# Patient Record
Sex: Male | Born: 2000 | Race: White | Hispanic: No | Marital: Single | Smoking: Never smoker
Health system: Southern US, Community
[De-identification: ages and names within clinical notes are randomized; demographics above are authoritative.]

## PROBLEM LIST (undated history)

## (undated) DIAGNOSIS — F509 Eating disorder, unspecified: Secondary | ICD-10-CM

## (undated) DIAGNOSIS — F4310 Post-traumatic stress disorder, unspecified: Secondary | ICD-10-CM

## (undated) DIAGNOSIS — Z62810 Personal history of physical and sexual abuse in childhood: Secondary | ICD-10-CM

## (undated) DIAGNOSIS — K219 Gastro-esophageal reflux disease without esophagitis: Secondary | ICD-10-CM

## (undated) DIAGNOSIS — R519 Headache, unspecified: Secondary | ICD-10-CM

## (undated) DIAGNOSIS — F909 Attention-deficit hyperactivity disorder, unspecified type: Secondary | ICD-10-CM

## (undated) HISTORY — DX: Headache, unspecified: R51.9

## (undated) HISTORY — DX: Personal history of physical and sexual abuse in childhood: Z62.810

## (undated) HISTORY — DX: Gastro-esophageal reflux disease without esophagitis: K21.9

## (undated) HISTORY — PX: SKULL FRACTURE ELEVATION: SHX781

## (undated) HISTORY — DX: Post-traumatic stress disorder, unspecified: F43.10

## (undated) HISTORY — DX: Attention-deficit hyperactivity disorder, unspecified type: F90.9

## (undated) HISTORY — DX: Eating disorder, unspecified: F50.9

---
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
## 2020-06-26 ENCOUNTER — Telehealth: Payer: Self-pay

## 2020-06-26 NOTE — Telephone Encounter (Signed)
Suggested asthma and allergy of [facility]

## 2020-06-26 NOTE — Telephone Encounter (Signed)
Mom would like the name of a good allergy / asthma adult doctoer Deray can start seeing please

## 2020-08-03 ENCOUNTER — Emergency Department (HOSPITAL_COMMUNITY)
Admission: EM | Admit: 2020-08-03 | Discharge: 2020-08-03 | Disposition: A | Discharge status: Home / Self Care | Payer: BC Managed Care – PPO | Attending: Emergency Medicine | Admitting: Emergency Medicine

## 2020-08-03 ENCOUNTER — Other Ambulatory Visit: Payer: Self-pay

## 2020-08-03 ENCOUNTER — Encounter (HOSPITAL_COMMUNITY): Payer: Self-pay

## 2020-08-03 DIAGNOSIS — J4541 Moderate persistent asthma with (acute) exacerbation: Secondary | ICD-10-CM | POA: Insufficient documentation

## 2020-08-03 DIAGNOSIS — R0602 Shortness of breath: Secondary | ICD-10-CM | POA: Diagnosis present

## 2020-08-03 MED ORDER — PREDNISONE 20 MG PO TABS: ORAL_TABLET | ORAL | 0 refills | Status: DC

## 2020-08-03 NOTE — ED Provider Notes (Signed)
[facility]-EMERGENCY DEPT Provider Note   CSN: [id number] Arrival date & time: 08/03/20  1636     History Chief Complaint  Patient presents with  . Asthma    Cody Waters is a 19 y.o. male.  The history is provided by the patient. No language interpreter was used.  Asthma     19 year old male significant history of asthma presenting for evaluation of shortness of breath.  Patient report for the past few months he has noticed that his asthma has last controlled with his usual home medication.Marland Kitchen  He is starting to use his inhaler a lot more frequently due to that.  States sometimes he upward to 20 puffs a day to control his symptoms.  Today after leaving work he endorsed increase shortness of breath, took 8 to 9 puffs of his albuterol inhaler and came to the ED.  Patient states he is currently feeling much better.  He felt that his symptoms may be secondary to secondhand smoke as this roommate does vape.  He denies any fever runny nose sneezing sore throat or cold symptoms.  He has been fully vaccinated for COVID-19.  Denies any prior history of PE.  He does have an appointment with pulmonologist in 3 weeks.  Past Medical History:  Diagnosis Date  . ADHD (attention deficit hyperactivity disorder)   . Asthma   . GERD (gastroesophageal reflux disease)     Patient Active Problem List   Diagnosis Date Noted  . Body mass index, pediatric, 5th percentile to less than 85th percentile for age 42/29/2015  . Encounter for routine child health examination without abnormal findings 02/27/2013    Past Surgical History:  Procedure Laterality Date  . HYDROCELE EXCISION Right 07/25/2016   Procedure: RIGHT HYDROCELECTOMY PEDIATRIC;  Surgeon: Leonia Corona, MD;  Location: [facility];  Service: Pediatrics;  Laterality: Right;       Family History  Problem Relation Age of Onset  . Alcohol abuse Neg Hx   . Arthritis Neg Hx   . Asthma Neg Hx   . Birth  defects Neg Hx   . Cancer Neg Hx   . COPD Neg Hx   . Depression Neg Hx   . Diabetes Neg Hx   . Drug abuse Neg Hx   . Early death Neg Hx   . Hearing loss Neg Hx   . Heart disease Neg Hx   . Hyperlipidemia Neg Hx   . Hypertension Neg Hx   . Learning disabilities Neg Hx   . Kidney disease Neg Hx   . Mental illness Neg Hx   . Mental retardation Neg Hx   . Miscarriages / Stillbirths Neg Hx   . Stroke Neg Hx   . Vision loss Neg Hx   . Varicose Veins Neg Hx     Social History   Tobacco Use  . Smoking status: Never Smoker  . Smokeless tobacco: Never Used  Substance Use Topics  . Alcohol use: No  . Drug use: No    Home Medications Prior to Admission medications   Medication Sig Start Date End Date Taking? Authorizing Provider  albuterol (PROVENTIL) (2.5 MG/3ML) 0.083% nebulizer solution INHALE 1 NEBULE VIA NEBULIZER EVERY 4 HOURS AS NEEDED FOR WHEEZING OR SHORTNESS OF BREATH 07/12/19   Georgiann Hahn, MD  albuterol (VENTOLIN HFA) 108 (90 Base) MCG/ACT inhaler INHALE 2 PUFFS INTO THE LUNGS EVERY 6 HOURS AS NEEDED FOR WHEEZING OR SHORTNESS OF BREATH 09/06/19   Georgiann Hahn, MD  beclomethasone (  QVAR) 80 MCG/ACT inhaler INHALE 1 PUFF INTO THE LUNGS TWICE DAILY Patient taking differently: 1 puff. INHALE 1 PUFF INTO THE LUNGS TWICE DAILY 06/04/16 07/17/16  Georgiann Hahn, MD  fluticasone (FLOVENT HFA) 110 MCG/ACT inhaler Inhale 1 puff into the lungs 2 (two) times daily. 07/12/19 08/11/19  Georgiann Hahn, MD  guaiFENesin (MUCINEX) 600 MG 12 hr tablet Take 600 mg by mouth 2 (two) times daily as needed for cough.    [provider]  HYDROcodone-acetaminophen (NORCO/VICODIN) 5-325 MG tablet Take 1-1.5 tablets by mouth every 6 (six) hours as needed for moderate pain. 07/25/16   Leonia Corona, MD  loratadine (CLARITIN) 10 MG tablet Take 1 tablet (10 mg total) by mouth daily as needed for allergies. 07/02/18 08/02/18  Georgiann Hahn, MD  montelukast (SINGULAIR) 10 MG  tablet Take 1 tablet (10 mg total) by mouth at bedtime. 09/04/18   Myles Gip, DO  Multiple Vitamins-Minerals (MULTIVITAMIN PO) Take 1 tablet by mouth daily.    [provider]  Nebulizers (COMPRESSOR/NEBULIZER) MISC Use albuterol and budesonide as directed on RX 02/01/11   Young, Sharmaine Base, MD  PROAIR HFA 108 (90 Base) MCG/ACT inhaler INHALE 2 PUFFS INTO THE LUNGS EVERY 6 HOURS AS NEEDED FOR WHEEZING OR SHORTNESS OF BREATH 02/11/17   Georgiann Hahn, MD    Allergies    Patient has no known allergies.  Review of Systems   Review of Systems  All other systems reviewed and are negative.   Physical Exam Updated Vital Signs BP 108/73   Pulse 63   Temp 98.5 F (36.9 C) (Oral)   Resp 16   SpO2 99%   Physical Exam Vitals and nursing note reviewed.  Constitutional:      General: He is not in acute distress.    Appearance: He is well-developed.  HENT:     Head: Atraumatic.  Eyes:     Conjunctiva/sclera: Conjunctivae normal.  Cardiovascular:     Rate and Rhythm: Normal rate and regular rhythm.     Pulses: Normal pulses.     Heart sounds: Normal heart sounds.  Pulmonary:     Effort: Pulmonary effort is normal.     Breath sounds: Normal breath sounds. No wheezing, rhonchi or rales.  Abdominal:     Palpations: Abdomen is soft.     Tenderness: There is no abdominal tenderness.  Musculoskeletal:     Cervical back: Neck supple.  Skin:    Findings: No rash.  Neurological:     Mental Status: He is alert. Mental status is at baseline.  Psychiatric:        Mood and Affect: Mood normal.     ED Results / Procedures / Treatments   Labs (all labs ordered are listed, but only abnormal results are displayed) Labs Reviewed - No data to display  EKG None  Radiology No results found.  Procedures Procedures (including critical care time)  Medications Ordered in ED Medications - No data to display  ED Course  I have reviewed the triage vital signs and the  nursing notes.  Pertinent labs & imaging results that were available during my care of the patient were reviewed by me and considered in my medical decision making (see chart for details).    MDM Rules/Calculators/A&P                          BP 108/73   Pulse 63   Temp 98.5 F (36.9 C) (Oral)  Resp 16   SpO2 99%   Final Clinical Impression(s) / ED Diagnoses Final diagnoses:  Moderate persistent asthma with exacerbation    Rx / DC Orders ED Discharge Orders         Ordered    predniSONE (DELTASONE) 20 MG tablet        08/03/20 1802         Patient with history of asthma presents complaining of asthma exacerbation.  He is well-appearing, speaking in complete sentences, lungs are clear to auscultation and no active wheezing at this time.  He is afebrile vital signs stable.  He does not have any cold symptoms.  Will prescribe prednisone course and he will follow-up closely with his pulmonologist for further care.  He does have available medications at home for his asthma.  Return precaution given.    Fayrene Helper, PA-C 08/03/20 1805    Gwyneth Sprout, MD 08/03/20 1816

## 2020-08-03 NOTE — ED Triage Notes (Signed)
Pt reports he had an asthma attack about an hour ago. Pt states he took 8-9 puffs of his albuterol inhaler about 30 minutes to an hour ago. Pt reports feeling much better now, but that he normally has another attack shortly after the first.

## 2020-08-07 ENCOUNTER — Ambulatory Visit (HOSPITAL_COMMUNITY)
Admission: RE | Admit: 2020-08-07 | Discharge: 2020-08-07 | Disposition: A | Discharge status: Home / Self Care | Payer: BC Managed Care – PPO | Attending: Psychiatry | Admitting: Psychiatry

## 2020-08-07 DIAGNOSIS — R45851 Suicidal ideations: Secondary | ICD-10-CM | POA: Insufficient documentation

## 2020-08-07 DIAGNOSIS — F419 Anxiety disorder, unspecified: Secondary | ICD-10-CM | POA: Diagnosis not present

## 2020-08-07 DIAGNOSIS — F32A Depression, unspecified: Secondary | ICD-10-CM | POA: Diagnosis not present

## 2020-08-07 NOTE — H&P (Signed)
Behavioral Health Medical Screening Exam  Cody Waters is an 19 y.o. male. He is presenting requesting referrals for counseling and medication management for depression and anxiety. He reports recent intermittent passive SI, no plan or intent. Denies current SI and contracts for safety.  Total Time spent with patient: 15 minutes  Psychiatric Specialty Exam: Physical Exam Vitals reviewed.  Constitutional:      Appearance: He is well-developed.  Cardiovascular:     Rate and Rhythm: Normal rate.  Pulmonary:     Effort: Pulmonary effort is normal.  Neurological:     Mental Status: He is alert and oriented to person, place, and time.    Review of Systems  Constitutional: Negative.   Respiratory: Negative for cough and shortness of breath.   Psychiatric/Behavioral: Positive for dysphoric mood. Negative for agitation, behavioral problems, confusion, decreased concentration, hallucinations, self-injury, sleep disturbance and suicidal ideas. The patient is nervous/anxious. The patient is not hyperactive.    There were no vitals taken for this visit.There is no height or weight on file to calculate BMI. General Appearance: Casual Eye Contact:  Good Speech:  Normal Rate Volume:  Normal Mood:  Anxious and Depressed Affect:  Congruent Thought Process:  Coherent and Goal Directed Orientation:  Full (Time, Place, and Person) Thought Content:  Logical Suicidal Thoughts:  No Homicidal Thoughts:  No Memory:  Immediate;   Good Recent;   Good Remote;   Good Judgement:  Fair Insight:  Fair Psychomotor Activity:  Decreased Concentration: Concentration: Fair and Attention Span: Fair Recall:  [employer] of Knowledge:Fair Language: Good Akathisia:  No Handed:  Right AIMS (if indicated):    Assets:  Communication Skills Desire for Improvement Financial Resources/Insurance Housing Vocational/Educational Sleep:     Musculoskeletal: Strength & Muscle Tone: within normal limits Gait &  Station: normal Patient leans: N/A  There were no vitals taken for this visit.  Recommendations: Based on my evaluation the patient does not appear to have an emergency medical condition.  Outpatient referrals.  Aldean Baker, NP 08/07/2020, 7:32 PM
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
## 2020-08-08 ENCOUNTER — Telehealth (HOSPITAL_COMMUNITY): Payer: Self-pay | Admitting: Psychiatry

## 2020-08-08 NOTE — Telephone Encounter (Signed)
D:  Deirdre (TTS) referred pt to MH-IOP.  A:  Placed call to orient pt and provide him with a start date.  Pt declined stating that his job has gotten very busy and he wouldn't be available to do the virtual group.  Inform Deirdre.

## 2020-08-14 ENCOUNTER — Other Ambulatory Visit: Payer: Self-pay | Admitting: Pediatrics

## 2020-08-18 ENCOUNTER — Encounter (HOSPITAL_COMMUNITY): Payer: Self-pay | Admitting: Emergency Medicine

## 2020-08-18 ENCOUNTER — Emergency Department (HOSPITAL_COMMUNITY)
Admission: EM | Admit: 2020-08-18 | Discharge: 2020-08-18 | Disposition: A | Discharge status: Home / Self Care | Payer: BC Managed Care – PPO | Attending: Emergency Medicine | Admitting: Emergency Medicine

## 2020-08-18 ENCOUNTER — Other Ambulatory Visit: Payer: Self-pay

## 2020-08-18 DIAGNOSIS — T486X1A Poisoning by antiasthmatics, accidental (unintentional), initial encounter: Secondary | ICD-10-CM | POA: Diagnosis present

## 2020-08-18 DIAGNOSIS — R42 Dizziness and giddiness: Secondary | ICD-10-CM | POA: Diagnosis not present

## 2020-08-18 DIAGNOSIS — R0602 Shortness of breath: Secondary | ICD-10-CM | POA: Diagnosis not present

## 2020-08-18 NOTE — ED Triage Notes (Signed)
Pt st's he has had approx 50 puffs from his albuterol today due to his asthma   Pt st's he called poison control to see if this was too much and they told him to come to the ED  Pt in no distress at this time

## 2020-08-18 NOTE — ED Provider Notes (Signed)
[facility] EMERGENCY DEPARTMENT Provider Note   CSN: [id number] Arrival date & time: 08/18/20  1803     History Chief Complaint  Patient presents with  . Drug Overdose    Cody Waters is a 19 y.o. male with a history of asthma presents with a chief complaint of overusing his albuterol inhaler.  Patient states that he used his albuterol inhaler approximately 30x1/2 nebulizer treatment today.  His last used his albuterol inhaler twice while waiting in the emergency room.  Patient reports he has been using his inhaler more often due to an increase in asthma attacks patient wants to have been worse over the last couple months and worse over the last few days.  Patient feels that his albuterol has become less effective.  Patient is asthma attacks are easily triggered and gives the example of "a pressure and breathing heavy next to him, a whiff of vape smoke on the wind, and sometimes food."  Patient reports that he has a visit with a pulmonologist this Wednesday to discuss his asthma.  Patient reports that today he contacted poison control about his albuterol use because he felt jittery had an increased heart rate, "red spots on his tongue."  When asked patient endorsed lightheadedness and dizziness but denies any loss of consciousness.  Patient reported the symptoms went away when he was able to sit down and rest.At present patient denies any difficulty breathing, shortness of breath, or jittery feeling.   Patient denied any suicidal or homicidal ideations.  Patient denied albuterol overuse as an attempt at self-harm.       Past Medical History:  Diagnosis Date  . ADHD (attention deficit hyperactivity disorder)   . Asthma   . GERD (gastroesophageal reflux disease)     Patient Active Problem List   Diagnosis Date Noted  . Body mass index, pediatric, 5th percentile to less than 85th percentile for age 25/29/2015  . Encounter for routine child health examination  without abnormal findings 02/27/2013    Past Surgical History:  Procedure Laterality Date  . HYDROCELE EXCISION Right 07/25/2016   Procedure: RIGHT HYDROCELECTOMY PEDIATRIC;  Surgeon: Leonia Corona, MD;  Location: [facility];  Service: Pediatrics;  Laterality: Right;       Family History  Problem Relation Age of Onset  . Alcohol abuse Neg Hx   . Arthritis Neg Hx   . Asthma Neg Hx   . Birth defects Neg Hx   . Cancer Neg Hx   . COPD Neg Hx   . Depression Neg Hx   . Diabetes Neg Hx   . Drug abuse Neg Hx   . Early death Neg Hx   . Hearing loss Neg Hx   . Heart disease Neg Hx   . Hyperlipidemia Neg Hx   . Hypertension Neg Hx   . Learning disabilities Neg Hx   . Kidney disease Neg Hx   . Mental illness Neg Hx   . Mental retardation Neg Hx   . Miscarriages / Stillbirths Neg Hx   . Stroke Neg Hx   . Vision loss Neg Hx   . Varicose Veins Neg Hx     Social History   Tobacco Use  . Smoking status: Never Smoker  . Smokeless tobacco: Never Used  Substance Use Topics  . Alcohol use: No  . Drug use: No    Home Medications Prior to Admission medications   Medication Sig Start Date End Date Taking? Authorizing Provider  albuterol (PROVENTIL) (2.5  MG/3ML) 0.083% nebulizer solution INHALE 1 NEBULE VIA NEBULIZER EVERY 4 HOURS AS NEEDED FOR WHEEZING OR SHORTNESS OF BREATH 07/12/19   Georgiann Hahn, MD  albuterol (VENTOLIN HFA) 108 (90 Base) MCG/ACT inhaler INHALE 2 PUFFS INTO THE LUNGS EVERY 6 HOURS AS NEEDED FOR WHEEZING OR SHORTNESS OF BREATH 09/06/19   Georgiann Hahn, MD  beclomethasone (QVAR) 80 MCG/ACT inhaler INHALE 1 PUFF INTO THE LUNGS TWICE DAILY Patient taking differently: 1 puff. INHALE 1 PUFF INTO THE LUNGS TWICE DAILY 06/04/16 07/17/16  Georgiann Hahn, MD  FLOVENT HFA 110 MCG/ACT inhaler INHALE 1 PUFF INTO THE LUNGS TWICE DAILY 08/14/20   Klett, Pascal Lux, NP  guaiFENesin (MUCINEX) 600 MG 12 hr tablet Take 600 mg by mouth 2 (two) times daily as  needed for cough.    [provider]  HYDROcodone-acetaminophen (NORCO/VICODIN) 5-325 MG tablet Take 1-1.5 tablets by mouth every 6 (six) hours as needed for moderate pain. 07/25/16   Leonia Corona, MD  loratadine (CLARITIN) 10 MG tablet Take 1 tablet (10 mg total) by mouth daily as needed for allergies. 07/02/18 08/02/18  Georgiann Hahn, MD  montelukast (SINGULAIR) 10 MG tablet Take 1 tablet (10 mg total) by mouth at bedtime. 09/04/18   Myles Gip, DO  Multiple Vitamins-Minerals (MULTIVITAMIN PO) Take 1 tablet by mouth daily.    [provider]  Nebulizers (COMPRESSOR/NEBULIZER) MISC Use albuterol and budesonide as directed on RX 02/01/11   Young, Madaline Brilliant A, MD  predniSONE (DELTASONE) 20 MG tablet 3 tabs po day one, then 2 po daily x 4 days 08/03/20   Fayrene Helper, PA-C  PROAIR HFA 108 (90 Base) MCG/ACT inhaler INHALE 2 PUFFS INTO THE LUNGS EVERY 6 HOURS AS NEEDED FOR WHEEZING OR SHORTNESS OF BREATH 02/11/17   Georgiann Hahn, MD    Allergies    Patient has no known allergies.  Review of Systems   Review of Systems  Constitutional: Positive for unexpected weight change (10lb weight loss ). Negative for chills and fever.  Eyes: Negative for visual disturbance.  Respiratory: Positive for shortness of breath. Negative for cough.   Cardiovascular: Negative for chest pain, palpitations and leg swelling.  Gastrointestinal: Negative for abdominal pain.  Genitourinary: Negative for difficulty urinating and dysuria.  Musculoskeletal: Negative for back pain and neck pain.  Skin: Negative for rash.  Neurological: Positive for dizziness (resolved) and light-headedness (resolved).  Psychiatric/Behavioral: Negative for confusion.    Physical Exam Updated Vital Signs BP 138/76   Pulse 98   Temp 97.6 F (36.4 C) (Oral)   Resp 20   Ht 5\' 11"  (1.803 m)   Wt 70.8 kg   SpO2 93%   BMI 21.76 kg/m   Physical Exam Constitutional:      General: He is not in acute  distress.    Appearance: He is normal weight. He is not ill-appearing or toxic-appearing.  HENT:     Head: Normocephalic and atraumatic.     Mouth/Throat:     Mouth: Mucous membranes are dry.  Eyes:     Pupils: Pupils are equal, round, and reactive to light.  Cardiovascular:     Rate and Rhythm: Tachycardia present.     Heart sounds: Normal heart sounds.  Pulmonary:     Effort: Pulmonary effort is normal. No respiratory distress.     Breath sounds: Normal breath sounds.  Abdominal:     General: Abdomen is flat.     Palpations: Abdomen is soft.  Musculoskeletal:     Cervical back: Neck supple.  Lymphadenopathy:     Cervical: No cervical adenopathy.  Skin:    General: Skin is warm and dry.  Neurological:     Mental Status: He is alert.     ED Results / Procedures / Treatments   Labs (all labs ordered are listed, but only abnormal results are displayed) Labs Reviewed - No data to display  EKG EKG Interpretation  Date/Time:  Friday August 18 2020 21:40:44 EST Ventricular Rate:  90 PR Interval:    QRS Duration: 85 QT Interval:  331 QTC Calculation: 405 R Axis:   79 Text Interpretation: Sinus rhythm Right atrial enlargement early repol diffuseliy No old tracing to compare Confirmed by Eber Hong ([id number]) on 08/18/2020 10:26:03 PM   Radiology No results found.  Procedures Procedures (including critical care time)  Medications Ordered in ED Medications - No data to display  ED Course  I have reviewed the triage vital signs and the nursing notes.  Pertinent labs & imaging results that were available during my care of the patient were reviewed by me and considered in my medical decision making (see chart for details).    MDM Rules/Calculators/A&P                          Patient is an alert 19 year-old male in no acute distress presents with a chief complaint of accidental albuterol overdose.  Patient reports that he has used his albuterol inhaler 30 times  today and a half of a nebulizer treatment.  Patient reported feeling jittery, increased heart rate, change in taste and red spots on his tongue.  Patient came to the emergency department after contacting poison control.  Patient denied any suicidal or homicidal ideations.  Patient denied albuterol overuse as an attempt at self-harm.  Patient reports he has an appointment with pulmonology this Wednesday to discuss his breathing and asthma.  The vast majority of the patient's albuterol use occurred prior to coming to the emergency department.  Patient reports using his inhaler twice while in the waiting room over a 3-hour period.  At present patient denies any shortness of breath, occultly breathing or jittery sensation.  Physical exam was unremarkable.  Vital signs were stable.  EKG showed sinus rhythm, right atrial enlargement early repole diffusely.  He was deemed safe to to be discharged home.    Patient was given strict instructions to only use his albuterol inhaler and nebulizers as prescribed.  Patient was given instructions on albuterol use and general information on albuterol medication.  Patient was given strict return precautions.     Final Clinical Impression(s) / ED Diagnoses Final diagnoses:  Accidental albuterol overdose, initial encounter    Rx / DC Orders ED Discharge Orders    None       Berneice Heinrich 08/18/20 2302    Eber Hong, MD 08/19/20 [phone]

## 2020-08-18 NOTE — ED Notes (Addendum)
Poison control called regarding update on patient. Previously spoke to [employer].  Current recommendation: basic labs, hydration PO/IV, and cardiac monitoring for 4 hours or until symptoms resolve.  Representative informed pt in triage and making way back to ED Room 31.   For more questions contact [place] with poison control. Recommendations communicated to PA [facility]

## 2020-08-18 NOTE — Discharge Instructions (Addendum)
You came to the emergency department to be evaluated because he used too much albuterol today.  Your physical exam was reassuring you are in no acute distress or immediate danger due to your albuterol use.  The symptoms of jittery feeling, increased heart rate, change in taste and dry mouth  are normal side effects of albuterol medication.   Moving forward using must limit your albuterol inhaler use to 2 to 4 puffs every 4-6 hours as needed for shortness of breath.  You may use nebulizer every 4-6 hours as needed as well.  Continue to use your Flovent, Singulair, and Claritin as prescribed.  Please read the handout on albuterol medication provided.  Please make sure to follow-up with your pulmonologist appointment this Wednesday.  Please return to the emergency department if:  You seem to be worse and are not responding to medicine during an asthma attack. You are short of breath even at rest. You get short of breath when doing very little activity. You have trouble eating, drinking, or talking. You have chest pain or tightness. You have a fast heartbeat. Your lips or fingernails start to turn blue. You are light-headed or dizzy, or you faint. Your peak flow is less than 50% of your personal best. You feel too tired to breathe normally.

## 2020-08-22 NOTE — Progress Notes (Signed)
New Patient Note  RE: Cody Waters MRN: [id number] DOB: 2000-11-02 Date of Office Visit: 08/23/2020  Referring provider: Georgiann Hahn, MD Primary care provider: Georgiann Hahn, MD  Chief Complaint: Asthma (started around age 18 )  History of Present Illness: I had the pleasure of seeing Cody Waters for initial evaluation at the [facility] on 08/23/2020. He is a 19 y.o. male, who is referred here by Georgiann Hahn, MD for the evaluation of asthma.  Asthma:  He reports symptoms of chest tightness, shortness of breath, coughing, wheezing, lightheadedness, nausea, ? Hallucinations, ? Vision changes, nocturnal awakenings for 9-10 years which has been worsening. Current medications include albuterol and Flovent (unknown dose) 2 puffs once a day x 2-3 years which help. He reports not using aerochamber with inhalers. He tried the following inhalers: nebulizer. Main triggers are foods (bread, milk), smoke, strong scents, physical activity. In the last month, frequency of symptoms: daily for the past 2-3 months. Frequency of nocturnal symptoms: at least once a night. Frequency of SABA use: after the ER visit using albuterol 4 times per day, before the ER visit using up to 50 times a day. Interference with physical activity: yes. Sleep is disturbed. In the last 12 months, emergency room visits/urgent care visits/doctor office visits or hospitalizations due to respiratory issues: 2. In the last 12 months, oral steroids courses: 1 course with some benefit. Lifetime history of hospitalization for respiratory issues: 1. Prior intubations: no. History of pneumonia: yes as a younger child. He was not evaluated by allergist/pulmonologist in the past. Smoking exposure: no. Up to date with flu vaccine: no. Up to date with COVID-19 vaccine: yes. No prior COVID-19 diagnosis.   History of reflux: yes and takes no medications for this. No recent CXR.  Some history of anxiety but no  prior medication treatment.   08/18/2020 ER visit: "Cody Waters is a 19 y.o. male with a history of asthma presents with a chief complaint of overusing his albuterol inhaler.  Patient states that he used his albuterol inhaler approximately 30x1/2 nebulizer treatment today.  His last used his albuterol inhaler twice while waiting in the emergency room.  Patient reports he has been using his inhaler more often due to an increase in asthma attacks patient wants to have been worse over the last couple months and worse over the last few days.  Patient feels that his albuterol has become less effective.  Patient is asthma attacks are easily triggered and gives the example of "a pressure and breathing heavy next to him, a whiff of vape smoke on the wind, and sometimes food."  Patient reports that he has a visit with a pulmonologist this Wednesday to discuss his asthma.  Patient reports that today he contacted poison control about his albuterol use because he felt jittery had an increased heart rate, "red spots on his tongue."  When asked patient endorsed lightheadedness and dizziness but denies any loss of consciousness.  Patient reported the symptoms went away when he was able to sit down and rest.At present patient denies any difficulty breathing, shortness of breath, or jittery feeling.   Patient denied any suicidal or homicidal ideations.  Patient denied albuterol overuse as an attempt at self-harm."  08/03/2020 ER visit: " 19 year old male significant history of asthma presenting for evaluation of shortness of breath.  Patient report for the past few months he has noticed that his asthma has last controlled with his usual home medication.Marland Kitchen  He is starting to  use his inhaler a lot more frequently due to that.  States sometimes he upward to 20 puffs a day to control his symptoms.  Today after leaving work he endorsed increase shortness of breath, took 8 to 9 puffs of his albuterol inhaler and came to the  ED.  Patient states he is currently feeling much better.  He felt that his symptoms may be secondary to secondhand smoke as this roommate does vape.  He denies any fever runny nose sneezing sore throat or cold symptoms.  He has been fully vaccinated for COVID-19.  Denies any prior history of PE.  He does have an appointment with pulmonologist in 3 weeks."  Assessment and Plan: Cody Waters is a 19 y.o. male with: Very poorly controlled asthma with acute exacerbation Diagnosed with asthma about 10 years ago.  Worsening symptoms the last 2 to 3 months requiring daily albuterol use.  Episodes of lightheadedness, nausea, hallucination, vision change.  ER visit twice in November due to this and using albuterol over 30 times causing palpitations. Currently on Flovent 2 puffs once a day and albuterol prn with some benefit.  Patient actively wheezing during exam which resolved after bronchodilator treatment.  Today's spirometry showed moderate obstruction with 28% improvement in FEV1 post bronchodilator treatment.  Clinically feeling much improved. Start prednisone taper:  2 tablets given in office today. Take 2 more tablets before bed today.  Then take 2 tablets twice a day for 2 more days. Then take 2 tablets once a day for 1 day. Then take 1 tablet once a day for 1 day.  . Please download app for inhaler to keep track of use.  . Daily controller medication(s): start Airduo Digihaler 220mcg/14mcg 1 puff twice a day and rinse mouth afterwards. This replaces Flovent. Samples and coupon card given.  Start Singulair (montelukast) 10mg  daily at night. Cautioned that in some children/adults can experience behavioral changes including hyperactivity, agitation, depression, sleep disturbances and suicidal ideations. These side effects are rare, but if you notice them you should notify me and discontinue Singulair (montelukast). . May use albuterol rescue inhaler 2 puffs every 4 to 6 hours as needed for shortness of  breath, chest tightness, coughing, and wheezing. May use albuterol rescue inhaler 2 puffs 5 to 15 minutes prior to strenuous physical activities. Monitor frequency of use.  . Repeat spirometry at next visit. . Get IgE and eosinophil counts today to see if meets criteria for biologics. . Concerning if patient was getting hypoxic during his episodes of lightheadedness, nausea, hallucination, vision change. He does have a pulse ox monitor now and advised to monitor.   If still has more episodes, he should be evaluated by his PCP for further work up.  . Patient also having issues with keeping up with his weight - recommend PCP follow up for this as well.   Other allergic rhinitis Perennial rhinitis symptoms and takes Zyrtec with some benefit.  Unsure if Singulair was effective.  2015 blood work was positive to dust mites, cat, dog and tree. 1 cat at home.  Start environmental control measures as below.  Start montelukast 10mg  daily as above.  May use over the counter antihistamines such as Zyrtec (cetirizine), Claritin (loratadine), Allegra (fexofenadine), or Xyzal (levocetirizine) daily as needed. May take twice a day during flares.   Get bloodwork for environmental allergy panel instead of skin testing today due to asthma flare.   Adverse food reaction Questionable reaction to gluten/milk at times.  This does not occur after every  ingestion.  However concerned whether this may be triggering his asthma as well.  Limit dairy and wheat intake for now. Unlikely to be the triggers for his asthma as it is not occurring after every exposure.  Get bloodwork.   Heartburn Some symptoms but not on any medications. Gave handout on lifestyle modification.  If no improvement, consider adding H2 blocker or PPI.   Return in about 2 weeks (around 09/06/2020).  Meds ordered this encounter  Medications  . Fluticasone-Salmeterol,sensor, (AIRDUO DIGIHALER) 232-14 MCG/ACT AEPB    Sig: Inhale 1 puff into  the lungs in the morning and at bedtime. Rinse mouth after each use.    Dispense:  1 each    Refill:  5    Please run coupon code.  . montelukast (SINGULAIR) 10 MG tablet    Sig: Take 1 tablet (10 mg total) by mouth at bedtime.    Dispense:  30 tablet    Refill:  5  . Albuterol Sulfate, sensor, (PROAIR DIGIHALER) 108 (90 Base) MCG/ACT AEPB    Sig: Inhale 2 puffs into the lungs every 4 (four) hours as needed (coughing, wheezing, shortness of breath).    Dispense:  1 each    Refill:  1    Please apply coupon code patient is bringing.    Lab Orders     Allergens w/Total IgE Area 2     CBC with Differential/Platelet     IgE Milk w/ Component Reflex     Wheat IgE  Other allergy screening: Rhino conjunctivitis: yes  Perennial rhinorrhea, nasal congestion and takes zyrtec prn with some benefit.  Patient is not taking Singulair currently and not sure if it was effective in the past.  Bloodwork in 2015 which was positive to: dust mites, cat, dog, tree. Borderline to peanut, apple.  Food allergy:  Gluten sometimes causes some mouth sores/pain which triggers his asthma. Sometimes consumes it without any issues. Milk causes GI issues.  Past work up includes: none. Dietary History: patient has been eating other foods including limited milk, eggs, peanut, treenuts, sesame, shellfish, fish, soy, wheat, meats, fruits and vegetables. Not sure about prior soy ingestion.   Medication allergy: no Hymenoptera allergy: no Urticaria: no Eczema:no History of recurrent infections suggestive of immunodeficency: no  Diagnostics: Spirometry:  Took albuterol at 8am this morning.  Tracings reviewed. His effort: Good reproducible efforts. FVC: 5.70L FEV1: 3.23L, 71% predicted FEV1/FVC ratio: 57% Interpretation: Spirometry consistent with moderate obstructive disease with 28% improvement in FEV1 post bronchodilator. Clinically feeling much improved and wheezing resolved.  Please see scanned  spirometry results for details.  Past Medical History: Patient Active Problem List   Diagnosis Date Noted  . Very poorly controlled asthma with acute exacerbation 08/23/2020  . Adverse food reaction 08/23/2020  . Heartburn 08/23/2020  . Other allergic rhinitis 08/23/2020  . Body mass index, pediatric, 5th percentile to less than 85th percentile for age 2/29/2015  . Encounter for routine child health examination without abnormal findings 02/27/2013   Past Medical History:  Diagnosis Date  . ADHD (attention deficit hyperactivity disorder)   . Asthma   . GERD (gastroesophageal reflux disease)   . Onychocryptosis 2012   Past Surgical History: Past Surgical History:  Procedure Laterality Date  . HYDROCELE EXCISION Right 07/25/2016   Procedure: RIGHT HYDROCELECTOMY PEDIATRIC;  Surgeon: Leonia Corona, MD;  Location: [facility];  Service: Pediatrics;  Laterality: Right;  . SKULL FRACTURE ELEVATION     Medication List:  Current Outpatient Medications  Medication Sig Dispense Refill  . albuterol (PROVENTIL) (2.5 MG/3ML) 0.083% nebulizer solution INHALE 1 NEBULE VIA NEBULIZER EVERY 4 HOURS AS NEEDED FOR WHEEZING OR SHORTNESS OF BREATH 75 mL 12  . Nebulizers (COMPRESSOR/NEBULIZER) MISC Use albuterol and budesonide as directed on RX 1 each 0  . Albuterol Sulfate, sensor, (PROAIR DIGIHALER) 108 (90 Base) MCG/ACT AEPB Inhale 2 puffs into the lungs every 4 (four) hours as needed (coughing, wheezing, shortness of breath). 1 each 1  . Fluticasone-Salmeterol,sensor, (AIRDUO DIGIHALER) 232-14 MCG/ACT AEPB Inhale 1 puff into the lungs in the morning and at bedtime. Rinse mouth after each use. 1 each 5  . montelukast (SINGULAIR) 10 MG tablet Take 1 tablet (10 mg total) by mouth at bedtime. 30 tablet 5   No current facility-administered medications for this visit.   Allergies: Allergies  Allergen Reactions  . Lactose Intolerance (Gi)    Social History: Social History    Socioeconomic History  . Marital status: Single    Spouse name: Not on file  . Number of children: Not on file  . Years of education: Not on file  . Highest education level: Not on file  Occupational History  . Not on file  Tobacco Use  . Smoking status: Never Smoker  . Smokeless tobacco: Never Used  Vaping Use  . Vaping Use: Never used  Substance and Sexual Activity  . Alcohol use: No  . Drug use: No  . Sexual activity: Not on file  Other Topics Concern  . Not on file  Social History Narrative   Lives at home with Mom   Social Determinants of Health   Financial Resource Strain:   . Difficulty of Paying Living Expenses: Not on file  Food Insecurity:   . Worried About Programme researcher, broadcasting/film/video in the Last Year: Not on file  . Ran Out of Food in the Last Year: Not on file  Transportation Needs:   . Lack of Transportation (Medical): Not on file  . Lack of Transportation (Non-Medical): Not on file  Physical Activity:   . Days of Exercise per Week: Not on file  . Minutes of Exercise per Session: Not on file  Stress:   . Feeling of Stress : Not on file  Social Connections:   . Frequency of Communication with Friends and Family: Not on file  . Frequency of Social Gatherings with Friends and Family: Not on file  . Attends Religious Services: Not on file  . Active Member of Clubs or Organizations: Not on file  . Attends Banker Meetings: Not on file  . Marital Status: Not on file   Lives in a house. Smoking: denies Occupation: Insurance claims handler HistorySurveyor, minerals in the house: yes Carpet in the family room: no Carpet in the bedroom: no Heating: gas Cooling: central Pet: yes 1 cat x 5 years  Family History: Family History  Problem Relation Age of Onset  . Allergic rhinitis Mother   . Asthma Father   . Allergic rhinitis Father   . Asthma Half-Brother   . Food Allergy Half-Brother   . Alcohol abuse Neg Hx   . Arthritis  Neg Hx   . Birth defects Neg Hx   . Cancer Neg Hx   . COPD Neg Hx   . Depression Neg Hx   . Diabetes Neg Hx   . Drug abuse Neg Hx   . Early death Neg Hx   . Hearing loss Neg Hx   . Heart  disease Neg Hx   . Hyperlipidemia Neg Hx   . Hypertension Neg Hx   . Learning disabilities Neg Hx   . Kidney disease Neg Hx   . Mental illness Neg Hx   . Mental retardation Neg Hx   . Miscarriages / Stillbirths Neg Hx   . Stroke Neg Hx   . Vision loss Neg Hx   . Varicose Veins Neg Hx   . Eczema Neg Hx   . Urticaria Neg Hx   . Immunodeficiency Neg Hx   . Angioedema Neg Hx    Review of Systems  Constitutional: Positive for unexpected weight change (issues with keeping the weight on). Negative for appetite change, chills and fever.  HENT: Positive for rhinorrhea and sore throat. Negative for congestion.   Eyes: Negative for itching.  Respiratory: Positive for cough, chest tightness, shortness of breath and wheezing.   Cardiovascular: Negative for chest pain.  Gastrointestinal: Negative for abdominal pain.  Genitourinary: Negative for difficulty urinating.  Skin: Negative for rash.  Allergic/Immunologic: Positive for environmental allergies.  Neurological: Negative for headaches.   Objective: BP 120/86   Pulse 97   Temp 98.1 F (36.7 C) (Temporal)   Resp 14   Ht 5\' 10"  (1.778 m)   Wt 156 lb (70.8 kg)   SpO2 97%   BMI 22.38 kg/m  Body mass index is 22.38 kg/m. Physical Exam Vitals and nursing note reviewed.  Constitutional:      Appearance: Normal appearance. He is well-developed.  HENT:     Head: Normocephalic and atraumatic.     Right Ear: Tympanic membrane and external ear normal.     Left Ear: Tympanic membrane and external ear normal.     Nose: Congestion (on left side) present.     Mouth/Throat:     Mouth: Mucous membranes are moist.     Pharynx: Oropharynx is clear.  Eyes:     Conjunctiva/sclera: Conjunctivae normal.  Cardiovascular:     Rate and Rhythm: Normal rate  and regular rhythm.     Heart sounds: Normal heart sounds. No murmur heard.  No friction rub. No gallop.   Pulmonary:     Effort: Pulmonary effort is normal.     Breath sounds: Wheezing present. No rhonchi or rales.     Comments: Resolved after bronchodilator treatment. Musculoskeletal:     Cervical back: Neck supple.  Skin:    General: Skin is warm.     Findings: No rash.  Neurological:     Mental Status: He is alert and oriented to person, place, and time.  Psychiatric:        Behavior: Behavior normal.    The plan was reviewed with the patient/family, and all questions/concerned were addressed.  It was my pleasure to see Cody Waters today and participate in his care. Please feel free to contact me with any questions or concerns.  Sincerely,  Wyline MoodYoon Luvena Wentling, DO Allergy & Immunology  [facility] [place] office: [phone] [facility] office: [phone]

## 2020-08-23 ENCOUNTER — Encounter: Payer: Self-pay | Admitting: Allergy

## 2020-08-23 ENCOUNTER — Ambulatory Visit: Payer: BC Managed Care – PPO | Admitting: Allergy

## 2020-08-23 ENCOUNTER — Other Ambulatory Visit: Payer: Self-pay

## 2020-08-23 VITALS — BP 120/86 | HR 97 | Temp 98.1°F | Resp 14 | Ht 70.0 in | Wt 156.0 lb

## 2020-08-23 DIAGNOSIS — R12 Heartburn: Secondary | ICD-10-CM

## 2020-08-23 DIAGNOSIS — T7819XD Other adverse food reactions, not elsewhere classified, subsequent encounter: Secondary | ICD-10-CM

## 2020-08-23 DIAGNOSIS — J45901 Unspecified asthma with (acute) exacerbation: Secondary | ICD-10-CM | POA: Diagnosis not present

## 2020-08-23 DIAGNOSIS — T781XXA Other adverse food reactions, not elsewhere classified, initial encounter: Secondary | ICD-10-CM | POA: Insufficient documentation

## 2020-08-23 DIAGNOSIS — J302 Other seasonal allergic rhinitis: Secondary | ICD-10-CM | POA: Insufficient documentation

## 2020-08-23 DIAGNOSIS — T781XXD Other adverse food reactions, not elsewhere classified, subsequent encounter: Secondary | ICD-10-CM | POA: Diagnosis not present

## 2020-08-23 DIAGNOSIS — J45909 Unspecified asthma, uncomplicated: Secondary | ICD-10-CM | POA: Insufficient documentation

## 2020-08-23 DIAGNOSIS — J3089 Other allergic rhinitis: Secondary | ICD-10-CM

## 2020-08-23 MED ORDER — PROAIR DIGIHALER 108 (90 BASE) MCG/ACT IN AEPB: 2.0000 | INHALATION_SPRAY | RESPIRATORY_TRACT | 1 refills | Status: DC | PRN

## 2020-08-23 MED ORDER — MONTELUKAST SODIUM 10 MG PO TABS: 10.0000 mg | ORAL_TABLET | Freq: Every day | ORAL | 5 refills | Status: DC

## 2020-08-23 MED ORDER — AIRDUO DIGIHALER 232-14 MCG/ACT IN AEPB: 1.0000 | INHALATION_SPRAY | Freq: Two times a day (BID) | RESPIRATORY_TRACT | 5 refills | Status: DC

## 2020-08-23 NOTE — Assessment & Plan Note (Signed)
Some symptoms but not on any medications. Gave handout on lifestyle modification.  If no improvement, consider adding H2 blocker or PPI.

## 2020-08-23 NOTE — Assessment & Plan Note (Addendum)
Perennial rhinitis symptoms and takes Zyrtec with some benefit.  Unsure if Singulair was effective.  2015 blood work was positive to dust mites, cat, dog and tree. 1 cat at home.  Start environmental control measures as below.  Start montelukast 10mg  daily as above.  May use over the counter antihistamines such as Zyrtec (cetirizine), Claritin (loratadine), Allegra (fexofenadine), or Xyzal (levocetirizine) daily as needed. May take twice a day during flares.   Get bloodwork for environmental allergy panel instead of skin testing today due to asthma flare.

## 2020-08-23 NOTE — Assessment & Plan Note (Addendum)
Diagnosed with asthma about 10 years ago.  Worsening symptoms the last 2 to 3 months requiring daily albuterol use.  Episodes of lightheadedness, nausea, hallucination, vision change.  ER visit twice in November due to this and using albuterol over 30 times causing palpitations. Currently on Flovent 2 puffs once a day and albuterol prn with some benefit.  Patient actively wheezing during exam which resolved after bronchodilator treatment.  Today's spirometry showed moderate obstruction with 28% improvement in FEV1 post bronchodilator treatment.  Clinically feeling much improved. Start prednisone taper:  2 tablets given in office today. Take 2 more tablets before bed today.  Then take 2 tablets twice a day for 2 more days. Then take 2 tablets once a day for 1 day. Then take 1 tablet once a day for 1 day.  . Please download app for inhaler to keep track of use.  . Daily controller medication(s): start Airduo Digihaler 230mcg/14mcg 1 puff twice a day and rinse mouth afterwards. This replaces Flovent. Samples and coupon card given.  Start Singulair (montelukast) 10mg  daily at night. Cautioned that in some children/adults can experience behavioral changes including hyperactivity, agitation, depression, sleep disturbances and suicidal ideations. These side effects are rare, but if you notice them you should notify me and discontinue Singulair (montelukast). . May use albuterol rescue inhaler 2 puffs every 4 to 6 hours as needed for shortness of breath, chest tightness, coughing, and wheezing. May use albuterol rescue inhaler 2 puffs 5 to 15 minutes prior to strenuous physical activities. Monitor frequency of use.  . Repeat spirometry at next visit. . Get IgE and eosinophil counts today to see if meets criteria for biologics. . Concerning if patient was getting hypoxic during his episodes of lightheadedness, nausea, hallucination, vision change. He does have a pulse ox monitor now and advised to monitor.    If still has more episodes, he should be evaluated by his PCP for further work up.  . Patient also having issues with keeping up with his weight - recommend PCP follow up for this as well.

## 2020-08-23 NOTE — Assessment & Plan Note (Addendum)
Questionable reaction to gluten/milk at times.  This does not occur after every ingestion.  However concerned whether this may be triggering his asthma as well.  Limit dairy and wheat intake for now. Unlikely to be the triggers for his asthma as it is not occurring after every exposure.  Get bloodwork.

## 2020-08-23 NOTE — Patient Instructions (Addendum)
Asthma - poorly controlled with acute exacerbation.  Prednisone 10mg  tablet pack: 2 tablets given in office today. Take 2 more tablets before bed today.  Then take 2 tablets twice a day for 2 more days. Then take 2 tablets once a day for 1 day. Then take 1 tablet once a day for 1 day.   . Please download app for inhaler to keep track of use.  . Daily controller medication(s): start Airduo Digihaler 229mcg/14mcg 1 puff twice a day and rinse mouth afterwards. This replaces Flovent. Samples given.  Start Singulair (montelukast) 10mg  daily at night. Cautioned that in some children/adults can experience behavioral changes including hyperactivity, agitation, depression, sleep disturbances and suicidal ideations. These side effects are rare, but if you notice them you should notify me and discontinue Singulair (montelukast). . May use albuterol rescue inhaler 2 puffs every 4 to 6 hours as needed for shortness of breath, chest tightness, coughing, and wheezing. May use albuterol rescue inhaler 2 puffs 5 to 15 minutes prior to strenuous physical activities. Monitor frequency of use.  . Asthma control goals:  o Full participation in all desired activities (may need albuterol before activity) o Albuterol use two times or less a week on average (not counting use with activity) o Cough interfering with sleep two times or less a month o Oral steroids no more than once a year o No hospitalizations  Environmental allergies  Start environmental control measures as below.  May use over the counter antihistamines such as Zyrtec (cetirizine), Claritin (loratadine), Allegra (fexofenadine), or Xyzal (levocetirizine) daily as needed. May take twice a day during flares.   Heartburn  See below for lifestyle modification for heartburn.  Food:  Limit dairy and wheat intake for now. Get bloodwork:  We are ordering labs, so please allow 1-2 weeks for the results to come back. With the newly implemented Cures  Act, the labs might be visible to you at the same time that they become visible to me. However, I will not address the results until all of the results are back, so please be patient.   Follow up in 2 weeks or sooner if needed.  Recommend establishing care with a PCP - regarding your weight loss.   Reducing Pollen Exposure . Pollen seasons: trees (spring), grass (summer) and ragweed/weeds (fall). 09-28-1982 Keep windows closed in your home and car to lower pollen exposure.  01-30-1981 air conditioning in the bedroom and throughout the house if possible.  . Avoid going out in dry windy days - especially early morning. . Pollen counts are highest between 5 - 10 AM and on dry, hot and windy days.  . Save outside activities for late afternoon or after a heavy rain, when pollen levels are lower.  . Avoid mowing of grass if you have grass pollen allergy. Marland Kitchen Be aware that pollen can also be transported indoors on people and pets.  . Dry your clothes in an automatic dryer rather than hanging them outside where they might collect pollen.  . Rinse hair and eyes before bedtime. Pet Allergen Avoidance: . Contrary to popular opinion, there are no "hypoallergenic" breeds of dogs or cats. That is because people are not allergic to an animal's hair, but to an allergen found in the animal's saliva, dander (dead skin flakes) or urine. Pet allergy symptoms typically occur within minutes. For some people, symptoms can build up and become most severe 8 to 12 hours after contact with the animal. People with severe allergies can experience reactions  in public places if dander has been transported on the pet owners' clothing. Marland Kitchen Keeping an animal outdoors is only a partial solution, since homes with pets in the yard still have higher concentrations of animal allergens. . Before getting a pet, ask your allergist to determine if you are allergic to animals. If your pet is already considered part of your family, try to minimize contact  and keep the pet out of the bedroom and other rooms where you spend a great deal of time. . As with dust mites, vacuum carpets often or replace carpet with a hardwood floor, tile or linoleum. . High-efficiency particulate air (HEPA) cleaners can reduce allergen levels over time. . While dander and saliva are the source of cat and dog allergens, urine is the source of allergens from rabbits, hamsters, mice and [place] pigs; so ask a non-allergic family member to clean the animal's cage. . If you have a pet allergy, talk to your allergist about the potential for allergy immunotherapy (allergy shots). This strategy can often provide long-term relief. Control of House Dust Mite Allergen . Dust mite allergens are a common trigger of allergy and asthma symptoms. While they can be found throughout the house, these microscopic creatures thrive in warm, humid environments such as bedding, upholstered furniture and carpeting. . Because so much time is spent in the bedroom, it is essential to reduce mite levels there.  . Encase pillows, mattresses, and box springs in special allergen-proof fabric covers or airtight, zippered plastic covers.  . Bedding should be washed weekly in hot water (130 F) and dried in a hot dryer. Allergen-proof covers are available for comforters and pillows that can't be regularly washed.  Cody Waters the allergy-proof covers every few months. Minimize clutter in the bedroom. Keep pets out of the bedroom.  Marland Kitchen Keep humidity less than 50% by using a dehumidifier or air conditioning. You can buy a humidity measuring device called a hygrometer to monitor this.  . If possible, replace carpets with hardwood, linoleum, or washable area rugs. If that's not possible, vacuum frequently with a vacuum that has a HEPA filter. . Remove all upholstered furniture and non-washable window drapes from the bedroom. . Remove all non-washable stuffed toys from the bedroom.  Wash stuffed toys  weekly.    Heartburn Heartburn is a type of pain or discomfort that can happen in the throat or chest. It is often described as a burning pain. It may also cause a bad, acid-like taste in the mouth. Heartburn may feel worse when you lie down or bend over. It may be worse at night. It may be caused by stomach contents that move back up (reflux) into the tube that connects the mouth with the stomach (esophagus). Follow these instructions at home: Eating and drinking   Avoid certain foods and drinks as told by your doctor. This may include: ? Coffee and tea (with or without caffeine). ? Drinks that have alcohol. ? Energy drinks and sports drinks. ? Carbonated drinks or sodas. ? Chocolate and cocoa. ? Peppermint and mint flavorings. ? Garlic and onions. ? Horseradish. ? Spicy and acidic foods, such as:  Peppers.  Chili powder and curry powder.  Vinegar.  Hot sauces and BBQ sauce. ? Citrus fruit juices and citrus fruits, such as:  Oranges.  Lemons.  Limes. ? Tomato-based foods, such as:  Red sauce and pizza with red sauce.  Chili.  Salsa. ? Fried and fatty foods, such as:  Donuts.  [place] fries and potato  chips.  High-fat dressings. ? High-fat meats, such as:  Hot dogs and sausage.  Rib eye steak.  Ham and bacon. ? High-fat dairy items, such as:  Whole milk.  Butter.  Cream cheese.  Eat small meals often. Avoid eating large meals.  Avoid drinking large amounts of liquid with your meals.  Avoid eating meals during the 2-3 hours before bedtime.  Avoid lying down right after you eat.  Do not exercise right after you eat. Lifestyle      If you are overweight, lose an amount of weight that is healthy for you. Ask your doctor about a safe weight loss goal.  Do not use any products that contain nicotine or tobacco, including cigarettes, e-cigarettes, and chewing tobacco. These can make your symptoms worse. If you need help quitting, ask your  doctor.  Wear loose clothes. Do not wear anything tight around your waist.  Raise (elevate) the head of your bed about 6 inches (15 cm) when you sleep.  Try to lower your stress. If you need help doing this, ask your doctor. General instructions  Pay attention to any changes in your symptoms.  Take over-the-counter and prescription medicines only as told by your doctor. ? Do not take aspirin, ibuprofen, or other NSAIDs unless your doctor says it is okay. ? Stop medicines only as told by your doctor.  Keep all follow-up visits as told by your doctor. This is important. Contact a doctor if:  You have new symptoms.  You lose weight and you do not know why it is happening.  You have trouble swallowing, or it hurts to swallow.  You have wheezing or a cough that keeps happening.  Your symptoms do not get better with treatment.  You have heartburn often for more than 2 weeks. Get help right away if:  You have pain in your arms, neck, jaw, teeth, or back.  You feel sweaty, dizzy, or light-headed.  You have chest pain or shortness of breath.  You throw up (vomit) and your throw up looks like blood or coffee grounds.  Your poop (stool) is bloody or black. These symptoms may represent a serious problem that is an emergency. Do not wait to see if the symptoms will go away. Get medical help right away. Call your local emergency services (911 in the [place] Do not drive yourself to the hospital. Summary  Heartburn is a type of pain that can happen in the throat or chest. It can feel like a burning pain. It may also cause a bad, acid-like taste in the mouth.  You may need to avoid certain foods and drinks to help your symptoms. Ask your doctor what foods and drinks you should avoid.  Take over-the-counter and prescription medicines only as told by your doctor. Do not take aspirin, ibuprofen, or other NSAIDs unless your doctor told you to do so.  Contact your doctor if your symptoms  do not get better or they get worse. This information is not intended to replace advice given to you by your health care provider. Make sure you discuss any questions you have with your health care provider. Document Revised: 02/09/2018 Document Reviewed: 02/09/2018 Elsevier Patient Education  2020 [employer].

## 2020-08-26 LAB — CBC WITH DIFFERENTIAL/PLATELET
Basophils Absolute: 0 10*3/uL (ref 0.0–0.2)
Basos: 1 %
EOS (ABSOLUTE): 0.5 10*3/uL — ABNORMAL HIGH (ref 0.0–0.4)
Eos: 10 %
Hematocrit: 53.8 % — ABNORMAL HIGH (ref 37.5–51.0)
Hemoglobin: 18.4 g/dL — ABNORMAL HIGH (ref 13.0–17.7)
Immature Grans (Abs): 0 10*3/uL (ref 0.0–0.1)
Immature Granulocytes: 0 %
Lymphocytes Absolute: 1.9 10*3/uL (ref 0.7–3.1)
Lymphs: 37 %
MCH: 30.7 pg (ref 26.6–33.0)
MCHC: 34.2 g/dL (ref 31.5–35.7)
MCV: 90 fL (ref 79–97)
Monocytes Absolute: 0.2 10*3/uL (ref 0.1–0.9)
Monocytes: 5 %
Neutrophils Absolute: 2.4 10*3/uL (ref 1.4–7.0)
Neutrophils: 47 %
Platelets: 186 10*3/uL (ref 150–450)
RBC: 6 x10E6/uL — ABNORMAL HIGH (ref 4.14–5.80)
RDW: 12.2 % (ref 11.6–15.4)
WBC: 5.1 10*3/uL (ref 3.4–10.8)

## 2020-08-26 LAB — ALLERGENS W/TOTAL IGE AREA 2
Alternaria Alternata IgE: <0.1 kU/L
Aspergillus Fumigatus IgE: <0.1 kU/L
Bermuda Grass IgE: <0.1 kU/L
Cat Dander IgE: >100 kU/L — AB
Cedar, Mountain IgE: <0.1 kU/L
Cladosporium Herbarum IgE: <0.1 kU/L
Cockroach, German IgE: <0.1 kU/L
Common Silver Birch IgE: 1.69 kU/L — AB
Cottonwood IgE: <0.1 kU/L
D Farinae IgE: 18.9 kU/L — AB
D Pteronyssinus IgE: 13.6 kU/L — AB
Dog Dander IgE: 18.9 kU/L — AB
Elm, American IgE: 0.28 kU/L — AB
IgE (Immunoglobulin E), Serum: 877 IU/mL — ABNORMAL HIGH (ref 6–495)
Johnson Grass IgE: <0.1 kU/L
Maple/Box Elder IgE: 0.26 kU/L — AB
Mouse Urine IgE: <0.1 kU/L
Oak, White IgE: 3.29 kU/L — AB
Pecan, Hickory IgE: 10.5 kU/L — AB
Penicillium Chrysogen IgE: <0.1 kU/L
Pigweed, Rough IgE: 0.37 kU/L — AB
Ragweed, Short IgE: <0.1 kU/L
Sheep Sorrel IgE Qn: 0.17 kU/L — AB
Timothy Grass IgE: <0.1 kU/L
White Mulberry IgE: <0.1 kU/L

## 2020-08-26 LAB — IGE MILK W/ COMPONENT REFLEX: F002-IgE Milk: <0.1 kU/L

## 2020-08-26 LAB — ALLERGEN, WHEAT, F4: Wheat IgE: <0.1 kU/L

## 2020-09-03 NOTE — Progress Notes (Signed)
[name]  RE: Cody Waters MRN: [id number] DOB: Apr 19, 2001 Date of Office Visit: 09/04/2020  Referring provider: Georgiann Hahn, MD Primary care provider: Georgiann Hahn, MD  Chief Complaint: Asthma  History of Present Illness: I had the pleasure of seeing Cody Waters for a follow up visit at the [facility] on 09/04/2020. He is a 19 y.o. male, who is being followed for asthma, allergic rhinitis, adverse food reaction and heartburn. His previous allergy office visit was on 08/23/2020 with Dr. Selena Batten. Today is a regular follow up visit.  Asthma: Denies any SOB, coughing, wheezing, chest tightness, nocturnal awakenings, ER/urgent care visits or prednisone use since the last visit. No albuterol use since the last visit. Finished prednisone. Currently on Digihaler 1 puff twice a day and Singulair daily.  Feeling much better. Gained some weight back.   Other allergic rhinitis Only taking Singulair at night.  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   Heartburn Asymptomatic.   Quit his job and now taking care of his mom for now.   Assessment and Plan: Cody Waters is a 19 y.o. male with: Asthma Past history - Diagnosed with asthma about 10 years ago.  Worsening symptoms the last 2 to 3 months requiring daily albuterol use.  Episodes of lightheadedness, nausea, hallucination, vision change.  ER visit twice in November due to this and using albuterol over 30 times causing palpitations. 2021 spirometry showed moderate obstruction with 28% improvement in FEV1 post bronchodilator treatment.  Clinically feeling much improved. Interim history - Doing much better with below regimen. No albuterol use. Elevated IgE and eos. Marland Kitchen Spirometry today was normal.  . Daily controller medication(s): continue Airduo Digihaler 230mcg/14mcg 1 puff twice a day and rinse mouth afterwards.  Continue Singulair (montelukast) 10mg  daily at night. . May use  albuterol rescue inhaler 2 puffs every 4 to 6 hours as needed for shortness of breath, chest tightness, coughing, and wheezing. May use albuterol rescue inhaler 2 puffs 5 to 15 minutes prior to strenuous physical activities. Monitor frequency of use.  . Repeat spirometry at next visit. . No biologics for now.   Seasonal and perennial allergic rhinitis Past history - Perennial rhinitis symptoms and takes Zyrtec with some benefit.  Unsure if Singulair was effective.  2015 blood work was positive to dust mites, cat, dog and tree. 1 cat at home. Interim history - 2021 Bloodwork positive to dust mites, cat, dog, tree pollen, weed pollen.  Continue environmental control measures as below.  Continue Singulair (montelukast) 10mg  daily at night.  Bloodwork positive to dust mites, cat, dog, tree pollen, weed pollen.  May use over the counter antihistamines such as Zyrtec (cetirizine), Claritin (loratadine), Allegra (fexofenadine), or Xyzal (levocetirizine) daily as needed. May take twice a day during flares.   Return in about 2 months (around 11/05/2020).  Diagnostics: Spirometry:  Tracings reviewed. His effort: Good reproducible efforts. FVC: 6.60L FEV1: 5.38L, 118% predicted FEV1/FVC ratio: 82% Interpretation: Spirometry consistent with normal pattern.  Please see scanned spirometry results for details.  Medication List:  Current Outpatient Medications  Medication Sig Dispense Refill  . albuterol (PROVENTIL) (2.5 MG/3ML) 0.083% nebulizer solution INHALE 1 NEBULE VIA NEBULIZER EVERY 4 HOURS AS NEEDED FOR WHEEZING OR SHORTNESS OF BREATH 75 mL 12  . Albuterol Sulfate, sensor, (PROAIR DIGIHALER) 108 (90 Base) MCG/ACT AEPB Inhale 2 puffs into the lungs every 4 (four) hours as needed (coughing, wheezing, shortness of breath). 1 each 1  . Fluticasone-Salmeterol,sensor, (AIRDUO [place])  232-14 MCG/ACT AEPB Inhale 1 puff into the lungs in the morning and at bedtime. Rinse mouth after each use. 1 each  5  . montelukast (SINGULAIR) 10 MG tablet Take 1 tablet (10 mg total) by mouth at bedtime. 30 tablet 5  . Nebulizers (COMPRESSOR/NEBULIZER) MISC Use albuterol and budesonide as directed on RX 1 each 0   No current facility-administered medications for this visit.   Allergies: Allergies  Allergen Reactions  . Lactose Intolerance (Gi)    I reviewed his past medical history, social history, family history, and environmental history and no significant changes have been reported from his previous visit.  Review of Systems  Constitutional: Positive for unexpected weight change (issues with keeping the weight on). Negative for appetite change, chills and fever.  HENT: Negative for congestion, rhinorrhea and sore throat.   Eyes: Negative for itching.  Respiratory: Negative for cough, chest tightness, shortness of breath and wheezing.   Cardiovascular: Negative for chest pain.  Gastrointestinal: Negative for abdominal pain.  Genitourinary: Negative for difficulty urinating.  Skin: Negative for rash.  Allergic/Immunologic: Positive for environmental allergies. Negative for food allergies.  Neurological: Negative for headaches.   Objective: BP 118/82   Pulse 91   Temp 98.1 F (36.7 C) (Temporal)   Resp 16   SpO2 98%  There is no height or weight on file to calculate BMI. Physical Exam Vitals and nursing note reviewed.  Constitutional:      Appearance: Normal appearance. He is well-developed.  HENT:     Head: Normocephalic and atraumatic.     Right Ear: Tympanic membrane and external ear normal.     Left Ear: Tympanic membrane and external ear normal.     Nose: Nose normal. No congestion.     Mouth/Throat:     Mouth: Mucous membranes are moist.     Pharynx: Oropharynx is clear.  Eyes:     Conjunctiva/sclera: Conjunctivae normal.  Cardiovascular:     Rate and Rhythm: Normal rate and regular rhythm.     Heart sounds: Normal heart sounds. No murmur heard. No friction rub. No  gallop.   Pulmonary:     Effort: Pulmonary effort is normal.     Breath sounds: Normal breath sounds. No wheezing, rhonchi or rales.  Musculoskeletal:     Cervical back: Neck supple.  Skin:    General: Skin is warm.     Findings: No rash.  Neurological:     Mental Status: He is alert and oriented to person, place, and time.  Psychiatric:        Behavior: Behavior normal.    Previous notes and tests were reviewed. The plan was reviewed with the patient/family, and all questions/concerned were addressed.  It was my pleasure to see Cody Waters today and participate in his care. Please feel free to contact me with any questions or concerns.  Sincerely,  Wyline Mood, DO Allergy & Immunology  [facility] office: [phone] [facility] office: [phone]

## 2020-09-04 ENCOUNTER — Ambulatory Visit: Payer: BC Managed Care – PPO | Admitting: Allergy

## 2020-09-04 ENCOUNTER — Encounter: Payer: Self-pay | Admitting: Allergy

## 2020-09-04 ENCOUNTER — Other Ambulatory Visit: Payer: Self-pay

## 2020-09-04 VITALS — BP 118/82 | HR 91 | Temp 98.1°F | Resp 16

## 2020-09-04 DIAGNOSIS — J3089 Other allergic rhinitis: Secondary | ICD-10-CM | POA: Diagnosis not present

## 2020-09-04 DIAGNOSIS — J45909 Unspecified asthma, uncomplicated: Secondary | ICD-10-CM | POA: Diagnosis not present

## 2020-09-04 DIAGNOSIS — J302 Other seasonal allergic rhinitis: Secondary | ICD-10-CM

## 2020-09-04 NOTE — Assessment & Plan Note (Signed)
Past history - Diagnosed with asthma about 10 years ago.  Worsening symptoms the last 2 to 3 months requiring daily albuterol use.  Episodes of lightheadedness, nausea, hallucination, vision change.  ER visit twice in November due to this and using albuterol over 30 times causing palpitations. 2021 spirometry showed moderate obstruction with 28% improvement in FEV1 post bronchodilator treatment.  Clinically feeling much improved. Interim history - Doing much better with below regimen. No albuterol use. Elevated IgE and eos. Marland Kitchen Spirometry today was normal.  . Daily controller medication(s): continue Airduo Digihaler 23mcg/14mcg 1 puff twice a day and rinse mouth afterwards.  Continue Singulair (montelukast) 10mg  daily at night. . May use albuterol rescue inhaler 2 puffs every 4 to 6 hours as needed for shortness of breath, chest tightness, coughing, and wheezing. May use albuterol rescue inhaler 2 puffs 5 to 15 minutes prior to strenuous physical activities. Monitor frequency of use.  . Repeat spirometry at next visit. . No biologics for now.

## 2020-09-04 NOTE — Assessment & Plan Note (Signed)
Past history - Perennial rhinitis symptoms and takes Zyrtec with some benefit.  Unsure if Singulair was effective.  2015 blood work was positive to dust mites, cat, dog and tree. 1 cat at home. Interim history - 2021 Bloodwork positive to dust mites, cat, dog, tree pollen, weed pollen.  Continue environmental control measures as below.  Continue Singulair (montelukast) 10mg  daily at night.  Bloodwork positive to dust mites, cat, dog, tree pollen, weed pollen.  May use over the counter antihistamines such as Zyrtec (cetirizine), Claritin (loratadine), Allegra (fexofenadine), or Xyzal (levocetirizine) daily as needed. May take twice a day during flares.

## 2020-09-04 NOTE — Patient Instructions (Addendum)
Asthma  Your breathing test was normal today.   Daily controller medication(s): continue Airduo Digihaler 245mcg/14mcg 1 puff twice a day and rinse mouth afterwards.  Continue Singulair (montelukast) 10mg  daily at night.  May use albuterol rescue inhaler 2 puffs every 4 to 6 hours as needed for shortness of breath, chest tightness, coughing, and wheezing. May use albuterol rescue inhaler 2 puffs 5 to 15 minutes prior to strenuous physical activities. Monitor frequency of use.   Asthma control goals:  o Full participation in all desired activities (may need albuterol before activity) o Albuterol use two times or less a week on average (not counting use with activity) o Cough interfering with sleep two times or less a month o Oral steroids no more than once a year o No hospitalizations  Environmental allergies  Continue environmental control measures as below.  Bloodwork positive to dust mites, cat, dog, tree pollen, weed pollen.  May use over the counter antihistamines such as Zyrtec (cetirizine), Claritin (loratadine), Allegra (fexofenadine), or Xyzal (levocetirizine) daily as needed. May take twice a day during flares.   Food:  No limitations in diet.   Follow up in 2 months or sooner if needed.  Recommend establishing care with a PCP.  Reducing Pollen Exposure  Pollen seasons: trees (spring), grass (summer) and ragweed/weeds (fall).  Keep windows closed in your home and car to lower pollen exposure.   Install air conditioning in the bedroom and throughout the house if possible.   Avoid going out in dry windy days - especially early morning.  Pollen counts are highest between 5 - 10 AM and on dry, hot and windy days.   Save outside activities for late afternoon or after a heavy rain, when pollen levels are lower.   Avoid mowing of grass if you have grass pollen allergy.  Be aware that pollen can also be transported indoors on people and pets.   Dry your clothes in an  automatic dryer rather than hanging them outside where they might collect pollen.   Rinse hair and eyes before bedtime. Pet Allergen Avoidance:  Contrary to popular opinion, there are no hypoallergenic breeds of dogs or cats. That is because people are not allergic to an animals hair, but to an allergen found in the animal's saliva, dander (dead skin flakes) or urine. Pet allergy symptoms typically occur within minutes. For some people, symptoms can build up and become most severe 8 to 12 hours after contact with the animal. People with severe allergies can experience reactions in public places if dander has been transported on the pet owners clothing.  Keeping an animal outdoors is only a partial solution, since homes with pets in the yard still have higher concentrations of animal allergens.  Before getting a pet, ask your allergist to determine if you are allergic to animals. If your pet is already considered part of your family, try to minimize contact and keep the pet out of the bedroom and other rooms where you spend a great deal of time.  As with dust mites, vacuum carpets often or replace carpet with a hardwood floor, tile or linoleum.  High-efficiency particulate air (HEPA) cleaners can reduce allergen levels over time.  While dander and saliva are the source of cat and dog allergens, urine is the source of allergens from rabbits, hamsters, mice and 01-30-1981 pigs; so ask a non-allergic family member to clean the animals cage.  If you have a pet allergy, talk to your allergist about the potential for allergy  immunotherapy (allergy shots). This strategy can often provide long-term relief. Control of House Dust Mite Allergen  Dust mite allergens are a common trigger of allergy and asthma symptoms. While they can be found throughout the house, these microscopic creatures thrive in warm, humid environments such as bedding, upholstered furniture and carpeting.  Because so much time is  spent in the bedroom, it is essential to reduce mite levels there.   Encase pillows, mattresses, and box springs in special allergen-proof fabric covers or airtight, zippered plastic covers.   Bedding should be washed weekly in hot water (130 F) and dried in a hot dryer. Allergen-proof covers are available for comforters and pillows that cant be regularly washed.   Wash the allergy-proof covers every few months. Minimize clutter in the bedroom. Keep pets out of the bedroom.   Keep humidity less than 50% by using a dehumidifier or air conditioning. You can buy a humidity measuring device called a hygrometer to monitor this.   If possible, replace carpets with hardwood, linoleum, or washable area rugs. If that's not possible, vacuum frequently with a vacuum that has a HEPA filter.  Remove all upholstered furniture and non-washable window drapes from the bedroom.  Remove all non-washable stuffed toys from the bedroom.  Wash stuffed toys weekly.

## 2020-10-10 ENCOUNTER — Ambulatory Visit: Payer: BC Managed Care – PPO | Admitting: Family Medicine

## 2020-10-16 ENCOUNTER — Ambulatory Visit: Payer: Self-pay | Admitting: Family Medicine

## 2020-10-19 ENCOUNTER — Telehealth: Payer: Self-pay | Admitting: Allergy

## 2020-10-19 NOTE — Telephone Encounter (Signed)
Forwarding to Dr. Selena Batten who has seen the pt

## 2020-10-19 NOTE — Telephone Encounter (Signed)
Patient states when he tried to get a refill on his proair diginhaler the pharmacy told him he had to say full price. Patient used a coupon card the first time he got it and it was only $40 for the Proair and montelukast.  Please advise.

## 2020-10-19 NOTE — Telephone Encounter (Signed)
Spoke to pharmacy and they said the coupon will not go through because his insurance will not pick up the cost of the inhaler. Please prescribe an alternate inhaler.

## 2020-10-20 MED ORDER — ALBUTEROL SULFATE HFA 108 (90 BASE) MCG/ACT IN AERS: 2.0000 | INHALATION_SPRAY | RESPIRATORY_TRACT | 2 refills | Status: DC | PRN

## 2020-10-20 NOTE — Telephone Encounter (Signed)
Sent in albuterol HFA inhaler.

## 2020-10-23 NOTE — Telephone Encounter (Signed)
Patient called to check on this prescription. I told him that Dr. Selena Batten sent in the albuterol HFA inhaler to [facility] on [address]. He said thank you.

## 2020-12-13 ENCOUNTER — Telehealth: Payer: Self-pay

## 2020-12-13 NOTE — Telephone Encounter (Signed)
New patient appt is scheduled for 3/29. Appt was made in January. He received new patient packet.  Patient will bring in packet before end of this week.  Records are already in Epic. Patient leaving peds office to come to family office.

## 2020-12-15 ENCOUNTER — Encounter: Payer: Self-pay | Admitting: Family Medicine

## 2020-12-18 ENCOUNTER — Encounter: Payer: Self-pay | Admitting: Family Medicine

## 2020-12-19 ENCOUNTER — Ambulatory Visit (INDEPENDENT_AMBULATORY_CARE_PROVIDER_SITE_OTHER): Payer: BC Managed Care – PPO | Admitting: Family Medicine

## 2020-12-19 ENCOUNTER — Other Ambulatory Visit: Payer: Self-pay

## 2020-12-19 ENCOUNTER — Encounter: Payer: Self-pay | Admitting: Family Medicine

## 2020-12-19 VITALS — BP 123/75 | HR 81 | Temp 98.3°F | Ht 68.79 in | Wt 159.0 lb

## 2020-12-19 DIAGNOSIS — Z7689 Persons encountering health services in other specified circumstances: Secondary | ICD-10-CM

## 2020-12-19 DIAGNOSIS — Z0000 Encounter for general adult medical examination without abnormal findings: Secondary | ICD-10-CM | POA: Diagnosis not present

## 2020-12-19 DIAGNOSIS — R030 Elevated blood-pressure reading, without diagnosis of hypertension: Secondary | ICD-10-CM

## 2020-12-19 DIAGNOSIS — Z79899 Other long term (current) drug therapy: Secondary | ICD-10-CM

## 2020-12-19 DIAGNOSIS — D582 Other hemoglobinopathies: Secondary | ICD-10-CM | POA: Diagnosis not present
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
## 2020-12-19 NOTE — Patient Instructions (Signed)
Preventive Care 18-21 Years Old, Male Preventive care refers to lifestyle choices and visits with your health care provider that can promote health and wellness. At this stage in your life, you may start seeing a primary care physician instead of a pediatrician. It is important to take responsibility for your health and well-being. Preventive care for young adults includes:  A yearly physical exam. This is also called an annual wellness visit.  Regular dental and eye exams.  Immunizations.  Screening for certain conditions.  Healthy lifestyle choices, such as: ? Eating a healthy diet. ? Getting regular exercise. ? Not using drugs or products that contain nicotine and tobacco. ? Limiting alcohol use. What can I expect for my preventive care visit? Physical exam Your health care provider may check your:  Height and weight. These may be used to calculate your BMI (body mass index). BMI is a measurement that tells if you are at a healthy weight.  Heart rate and blood pressure.  Body temperature.  Skin for abnormal spots. Counseling Your health care provider may ask you questions about your:  Past medical problems.  Family's medical history.  Alcohol, tobacco, and drug use.  Home life and relationship well-being.  Access to firearms.  Emotional well-being.  Diet, exercise, and sleep habits.  Sexual activity and sexual health. What immunizations do I need? Vaccines are usually given at various ages, according to a schedule. Your health care provider will recommend vaccines for you based on your age, medical history, and lifestyle or other factors, such as travel or where you work.   What tests do I need? Blood tests  Lipid and cholesterol levels. These may be checked every 5 years starting at age 20.  Hepatitis C test.  Hepatitis B test. Screening  Genital exam to check for testicular cancer or hernias.  STD (sexually transmitted disease) testing, if you are at  risk. Other tests  Tuberculosis skin test.  Vision and hearing tests.  Skin exam. Talk with your health care provider about your test results, treatment options, and if necessary, the need for more tests. Follow these instructions at home: Eating and drinking  Eat a healthy diet that includes fresh fruits and vegetables, whole grains, lean protein, and low-fat dairy products.  Drink enough fluid to keep your urine pale yellow.  Do not drink alcohol if: ? Your health care provider tells you not to drink. ? You are under the legal drinking age. In the [place] the legal drinking age is 21.  If you drink alcohol: ? Limit how much you use to 0-2 drinks a day. ? Be aware of how much alcohol is in your drink. In the [place] one drink equals one 12 oz bottle of beer (355 mL), one 5 oz glass of wine (148 mL), or one 1 oz glass of hard liquor (44 mL).   Lifestyle  Take daily care of your teeth and gums. Brush your teeth every morning and night with fluoride toothpaste. Floss one time each day.  Stay active. Exercise for at least 30 minutes 5 or more days of the week.  Do not use any products that contain nicotine or tobacco, such as cigarettes, e-cigarettes, and chewing tobacco. If you need help quitting, ask your health care provider.  Do not use drugs.  If you are sexually active, practice safe sex. Use a condom or other form of protection to prevent STIs (sexually transmitted infections).  Find healthy ways to cope with stress, such as: ? Meditation, yoga,   or listening to music. ? Journaling. ? Talking to a trusted person. ? Spending time with friends and family. Safety  Always wear your seat belt while driving or riding in a vehicle.  Do not drive: ? If you have been drinking alcohol. Do not ride with someone who has been drinking. ? When you are tired or distracted. ? While texting.  Wear a helmet and other protective equipment during sports activities.  If you have  firearms in your house, make sure you follow all gun safety procedures.  Seek help if you have been bullied, physically abused, or sexually abused.  Use the Internet responsibly to avoid dangers, such as online bullying and online sex predators. What's next?  Go to your health care provider once a year for an annual wellness visit.  Ask your health care provider how often you should have your eyes and teeth checked.  Stay up to date on all vaccines. This information is not intended to replace advice given to you by your health care provider. Make sure you discuss any questions you have with your health care provider. Document Revised: 05/26/2019 Document Reviewed: 09/03/2018 Elsevier Patient Education  2021 Elsevier Inc.  

## 2020-12-20 LAB — COMPREHENSIVE METABOLIC PANEL
AG Ratio: 2.2 (calc) (ref 1.0–2.5)
ALT: 6 U/L — ABNORMAL LOW (ref 8–46)
AST: 14 U/L (ref 12–32)
Albumin: 5.1 g/dL (ref 3.6–5.1)
Alkaline phosphatase (APISO): 55 U/L (ref 46–169)
BUN: 11 mg/dL (ref 7–20)
CO2: 25 mmol/L (ref 20–32)
Calcium: 9.8 mg/dL (ref 8.9–10.4)
Chloride: 103 mmol/L (ref 98–110)
Creat: 1 mg/dL (ref 0.60–1.26)
Globulin: 2.3 g/dL (calc) (ref 2.1–3.5)
Glucose, Bld: 69 mg/dL (ref 65–99)
Potassium: 4.2 mmol/L (ref 3.8–5.1)
Sodium: 142 mmol/L (ref 135–146)
Total Bilirubin: 0.8 mg/dL (ref 0.2–1.1)
Total Protein: 7.4 g/dL (ref 6.3–8.2)

## 2020-12-20 LAB — CBC WITH DIFFERENTIAL/PLATELET
Absolute Monocytes: 260 cells/uL (ref 200–950)
Basophils Absolute: 18 cells/uL (ref 0–200)
Basophils Relative: 0.3 %
Eosinophils Absolute: 218 cells/uL (ref 15–500)
Eosinophils Relative: 3.7 %
HCT: 46.4 % (ref 38.5–50.0)
Hemoglobin: 15.9 g/dL (ref 13.2–17.1)
Lymphs Abs: 1634 cells/uL (ref 850–3900)
MCH: 30.2 pg (ref 27.0–33.0)
MCHC: 34.3 g/dL (ref 32.0–36.0)
MCV: 88 fL (ref 80.0–100.0)
MPV: 11 fL (ref 7.5–12.5)
Monocytes Relative: 4.4 %
Neutro Abs: 3770 cells/uL (ref 1500–7800)
Neutrophils Relative %: 63.9 %
Platelets: 212 10*3/uL (ref 140–400)
RBC: 5.27 10*6/uL (ref 4.20–5.80)
RDW: 11.7 % (ref 11.0–15.0)
Total Lymphocyte: 27.7 %
WBC: 5.9 10*3/uL (ref 3.8–10.8)

## 2020-12-20 LAB — IRON,TIBC AND FERRITIN PANEL
%SAT: 42 % (calc) (ref 16–48)
Ferritin: 102 ng/mL (ref 38–380)
Iron: 141 ug/dL (ref 27–164)
TIBC: 336 mcg/dL (calc) (ref 271–448)

## 2020-12-20 LAB — TSH: TSH: 1.25 mIU/L (ref 0.50–4.30)

## 2021-08-14 ENCOUNTER — Encounter: Payer: Self-pay | Admitting: Family

## 2021-08-14 ENCOUNTER — Other Ambulatory Visit: Payer: Self-pay

## 2021-08-14 ENCOUNTER — Telehealth (INDEPENDENT_AMBULATORY_CARE_PROVIDER_SITE_OTHER): Payer: BC Managed Care – PPO | Admitting: Family

## 2021-08-14 VITALS — Ht 69.0 in | Wt 159.0 lb

## 2021-08-14 DIAGNOSIS — R6889 Other general symptoms and signs: Secondary | ICD-10-CM | POA: Diagnosis not present

## 2021-08-14 MED ORDER — PROMETHAZINE-DM 6.25-15 MG/5ML PO SYRP: 5.0000 mL | ORAL_SOLUTION | Freq: Four times a day (QID) | ORAL | 0 refills | Status: DC | PRN

## 2021-08-14 MED ORDER — OSELTAMIVIR PHOSPHATE 75 MG PO CAPS: 75.0000 mg | ORAL_CAPSULE | Freq: Two times a day (BID) | ORAL | 0 refills | Status: DC

## 2021-08-14 NOTE — Progress Notes (Signed)
Virtual Visit via Telephone Note  I connected with Naquan Garman on 08/14/21 at 11:00 AM EST by telephone and verified that I am speaking with the correct person using two identifiers.  Location: Patient: Home Provider: [employer]    I discussed the limitations, risks, security and privacy concerns of performing an evaluation and management service by telephone and the availability of in person appointments. I also discussed with the patient that there may be a patient responsible charge related to this service. The patient expressed understanding and agreed to proceed.   History of Present Illness:  20 year old male presents with c/o nausea, cough, congestion, body aches and fatigue after being exposed to the flu by several people 3 days ago. He had an influenza vaccine on 08/02/21.     Observations/Objective: A&O, NAD, no breathing difficulty    Assessment and Plan: Ciel was seen today for generalized body aches and sore throat.  Diagnoses and all orders for this visit:  Flu-like symptoms  Other orders -     promethazine-dextromethorphan (PROMETHAZINE-DM) 6.25-15 MG/5ML syrup; Take 5 mLs by mouth 4 (four) times daily as needed for cough. -     oseltamivir (TAMIFLU) 75 MG capsule; Take 1 capsule (75 mg total) by mouth 2 (two) times daily.     Follow Up Instructions:     I discussed the assessment and treatment plan with the patient. The patient was provided an opportunity to ask questions and all were answered. The patient agreed with the plan and demonstrated an understanding of the instructions.   The patient was advised to call back or seek an in-person evaluation if the symptoms worsen or if the condition fails to improve as anticipated.  I provided 20 minutes of non-face-to-face time during this encounter.   Eulis Foster, FNP

## 2021-08-25 ENCOUNTER — Other Ambulatory Visit: Payer: Self-pay | Admitting: Allergy

## 2021-09-07 ENCOUNTER — Other Ambulatory Visit: Payer: Self-pay | Admitting: Allergy

## 2021-09-10 ENCOUNTER — Telehealth: Payer: Self-pay | Admitting: Allergy

## 2021-09-10 NOTE — Telephone Encounter (Signed)
Disregard. Error.

## 2021-09-11 ENCOUNTER — Encounter: Payer: Self-pay | Admitting: Family Medicine

## 2021-09-11 ENCOUNTER — Other Ambulatory Visit: Payer: Self-pay

## 2021-09-11 ENCOUNTER — Ambulatory Visit: Payer: BC Managed Care – PPO | Admitting: Family Medicine

## 2021-09-11 VITALS — BP 101/54 | HR 71 | Temp 98.1°F | Ht 69.0 in | Wt 170.6 lb

## 2021-09-11 VITALS — BP 120/80 | HR 72 | Temp 97.8°F | Resp 16 | Ht 69.5 in | Wt 171.0 lb

## 2021-09-11 DIAGNOSIS — H5711 Ocular pain, right eye: Secondary | ICD-10-CM

## 2021-09-11 DIAGNOSIS — H579 Unspecified disorder of eye and adnexa: Secondary | ICD-10-CM | POA: Diagnosis not present

## 2021-09-11 DIAGNOSIS — K219 Gastro-esophageal reflux disease without esophagitis: Secondary | ICD-10-CM

## 2021-09-11 DIAGNOSIS — J3089 Other allergic rhinitis: Secondary | ICD-10-CM

## 2021-09-11 DIAGNOSIS — J302 Other seasonal allergic rhinitis: Secondary | ICD-10-CM

## 2021-09-11 DIAGNOSIS — J4540 Moderate persistent asthma, uncomplicated: Secondary | ICD-10-CM | POA: Insufficient documentation

## 2021-09-11 MED ORDER — ALBUTEROL SULFATE HFA 108 (90 BASE) MCG/ACT IN AERS: 2.0000 | INHALATION_SPRAY | RESPIRATORY_TRACT | 2 refills | Status: DC | PRN

## 2021-09-11 MED ORDER — MONTELUKAST SODIUM 10 MG PO TABS: 10.0000 mg | ORAL_TABLET | Freq: Every day | ORAL | 5 refills | Status: DC

## 2021-09-11 MED ORDER — AIRDUO DIGIHALER 232-14 MCG/ACT IN AEPB
1.0000 | INHALATION_SPRAY | Freq: Two times a day (BID) | RESPIRATORY_TRACT | 5 refills | Status: DC
Start: 2021-09-11 — End: 2021-11-01

## 2021-09-11 NOTE — Progress Notes (Signed)
[address] [place] [address] Dept: [phone]  [name]  Patient ID: Cody Waters, male    DOB: 26-Mar-2001  Age: 20 y.o. MRN: [id number] Date of Office Visit: 09/11/2021  Assessment  Chief Complaint: Asthma  HPI Cody Waters is a 20 year old male who presents the clinic for follow-up visit.  He was last seen in this clinic on 09/04/2020 by Dr. Selena Batten for evaluation of asthma and allergic rhinitis.  At that time, his reflux was reported as well controlled and he was tolerating gluten and dairy in his diet with no adverse reaction. In the interim, he was diagnosed with flu like symptoms on 08/14/2021 for which he took Tamiflu with resolution of symptoms.  At today's visit, he reports his asthma has been moderately well controlled with "asthma attacks" occurring once or twice a month with symptoms including sweating, slight panic, body aches, vision change, shortness of breath, and wheeze.  He reports that during an attack he uses albuterol and rests with symptoms resolving in about 1 hour.  He continues montelukast 10 mg once a day, AirDuo Digihaler 232-1 puff twice a day, and uses albuterol 2-3 times a week with relief of symptoms.  Lab testing from 08/23/2020 indicates absolute eosinophils 500.  Allergic rhinitis is reported as moderately well controlled symptoms including occasional clear rhinorrhea, and frequent nasal congestion, sneezing fits, and postnasal drainage with frequent throat clearing.  He reports year-round symptoms with seasonal variation occurring in the spring and fall.  He continues a daily antihistamine and is not currently using Flonase or nasal saline rinses.  His last allergy testing was via blood work on 08/23/2020 and was positive to dust mites, cat, dog, tree pollen, and weed pollen.  Dust mite covers are currently in use on the mattress and pillows. Reflux is reported as moderately well controlled with heartburn occurring about once or twice a month with no  medical intervention.  He does report infrequent vomiting.  He continues consumption of milk, cheese, ice cream, and gluten with no adverse reaction.  He does report tenderness on the roof of his mouth occasionally after eating white bread or Snickers bars which lasts for about 10 minutes.  He denies facial and throat angioedema, integumentary, cardiopulmonary, or gastrointestinal symptoms with the mouth tenderness.  His current medications are listed in the chart.   Drug Allergies:  Allergies  Allergen Reactions   Lactose Intolerance (Gi)    Other     cat, dogs, ragweed and pollen    Physical Exam: BP 120/80    Pulse 72    Temp 97.8 F (36.6 C) (Temporal)    Resp 16    Ht 5' 9.5" (1.765 m)    Wt 171 lb (77.6 kg)    BMI 24.89 kg/m    Physical Exam Vitals reviewed.  Constitutional:      Appearance: Normal appearance.  HENT:     Head: Normocephalic and atraumatic.     Right Ear: Tympanic membrane normal.     Left Ear: Tympanic membrane normal.     Nose:     Comments: Bilateral nares slightly erythematous with clear nasal drainage noted.  Pharynx normal.  Ears normal.  Eyes normal.    Mouth/Throat:     Pharynx: Oropharynx is clear.     Comments: No open areas noted on the roof of his mouth Eyes:     Conjunctiva/sclera: Conjunctivae normal.  Cardiovascular:     Rate and Rhythm: Normal rate and regular rhythm.  Heart sounds: Normal heart sounds. No murmur heard. Pulmonary:     Effort: Pulmonary effort is normal.     Breath sounds: Normal breath sounds.     Comments: Lungs clear to auscultation Musculoskeletal:        General: Normal range of motion.     Cervical back: Normal range of motion and neck supple.  Skin:    General: Skin is warm and dry.  Neurological:     Mental Status: He is alert and oriented to person, place, and time.  Psychiatric:        Mood and Affect: Mood normal.        Behavior: Behavior normal.        Thought Content: Thought content normal.         Judgment: Judgment normal.    Diagnostics: FVC 4.72, FEV1 3.73.  Predicted FVC 5.49, predicted FEV1 4.65.  Spirometry indicates normal ventilatory function.  Assessment and Plan: 1. Not well controlled moderate persistent asthma   2. Seasonal and perennial allergic rhinitis   3. Gastroesophageal reflux disease, unspecified whether esophagitis present     Meds ordered this encounter  Medications   albuterol (VENTOLIN HFA) 108 (90 Base) MCG/ACT inhaler    Sig: Inhale 2 puffs into the lungs every 4 (four) hours as needed for wheezing or shortness of breath.    Dispense:  18 g    Refill:  2   montelukast (SINGULAIR) 10 MG tablet    Sig: Take 1 tablet (10 mg total) by mouth at bedtime.    Dispense:  30 tablet    Refill:  5   Fluticasone-Salmeterol,sensor, (AIRDUO DIGIHALER) 232-14 MCG/ACT AEPB    Sig: Inhale 1 puff into the lungs in the morning and at bedtime. Rinse mouth after each use.    Dispense:  1 each    Refill:  5    Please run coupon code.    Patient Instructions  Asthma Continue Air Duo 323-1 puff twice a day to prevent cough or wheeze Continue montelukast 10 mg once a day to prevent cough or wheeze Continue albuterol 2 puffs once every 4 hours as needed for cough or wheeze You may use albuterol 2 puffs 5 to 15 minutes before activity to decrease cough or wheeze Based on lab tests from 1 year ago, you qualify for a biologic medication to control your asthma. Call the clinic if you are interested in this option  Allergic rhinitis Continue allergen avoidance measures directed toward tree pollen, weed pollen, cat, dog, and dust mites as listed below Continue cetirizine 10 mg once a day as needed for runny nose or itch. Remember to rotate to a different antihistamine about every 3 months. Some examples of over the counter antihistamines include Zyrtec (cetirizine), Xyzal (levocetirizine), Allegra (fexofenadine), and Claritin (loratidine).  Begin Flonase 2 sprays in each  nostril once a day as needed for stuffy nose.  In the right nostril, point the applicator out toward the right ear. In the left nostril, point the applicator out toward the left ear Consider saline nasal rinses as needed for nasal symptoms. Use this before any medicated nasal sprays for best result Allergen immunotherapy information packet provided.  Call the clinic if you are interested in beginning allergy injections.  Reflux Continue dietary and lifestyle modifications as listed below  Call the clinic if this treatment plan is not working well for you.  Follow up in 2 months or sooner if needed.   Return in about 2 months (around  11/12/2021), or if symptoms worsen or fail to improve.    Thank you for the opportunity to care for this patient.  Please do not hesitate to contact me with questions.  [name], FNP [facility]

## 2021-09-11 NOTE — Patient Instructions (Signed)
Glaucoma °Glaucoma is a condition that is caused mainly by high pressure inside the eye. The pressure in the eye is raised because fluid (aqueous humor) within the eye cannot get out through the normal drainage system. The increased pressure can cause damage to the nerves of the eye, and that can result in loss of vision. It is important to diagnose glaucoma early before damage occurs. Early treatment can often prevent vision loss. °There are two main types of glaucoma: °Open-angle glaucoma. This is a long-term (chronic) condition that causes the pressure inside the eye to rise slowly. This is the most common type of glaucoma. In many cases, it does not cause symptoms until later in the disease. °Acute angle-closure glaucoma. With this type, the pressure inside the eye rises suddenly to a very high level. This causes severe pain and requires treatment right away. °What are the causes? °In many cases, the cause of this condition is not known. Glaucoma can sometimes result from other diseases, such as infection, cataracts, or tumors. °What increases the risk? °The following factors may make you more likely to develop this condition: °Being older than age 40. °Having high blood pressure or diabetes. °Having a family history of glaucoma. °Having a previous eye injury or eye surgery. °Being farsighted. °Using certain medicines that increase pressure in the eyes, such as steroids, or using medicines that cause the pupils to widen (dilate). °What are the signs or symptoms? °Open-angle glaucoma often causes no symptoms early on. If it is not treated, the condition will get worse and may cause a loss of side vision (peripheral vision). This can advance to tunnel vision, which means that you are able to see straight ahead but you have a loss of peripheral vision in all directions. Eventually, total loss of vision can occur. °Symptoms of acute angle-closure glaucoma develop suddenly and may include: °Cloudy vision. °Severe  pain in your affected eye. °A severe headache in the area around your eye. °Feeling nauseous. °Vomiting. °How is this diagnosed? °This condition is diagnosed through an eye exam by an eye specialist (ophthalmologist or optometrist). This specialist will: °Perform a test to measure the pressure in your eye (tonometry). °Do eye tests to check your vision, especially your peripheral vision. °Use various instruments to look inside your eye and check for damage to the nerves. °Because glaucoma often does not cause symptoms until late in the disease, it is often found during a routine eye exam. Be sure to have your eyes checked regularly. °How is this treated? °Early treatment is important to prevent vision loss. Treatment will focus on lowering the pressure in your eyes. This usually involves using medicines in the form of eye drops. The type of medicine will depend on how bad the disease is and the degree of damage. Laser treatments or other types of surgery may be needed in some cases. °Acute angle-closure glaucoma requires emergency treatment to help prevent vision loss. This treatment may involve eye drops and medicines that are given in pill form or through an IV. Surgery, which may be done with a laser, is often performed after the pressure has been lowered with medicine. °Follow these instructions at home: °Medicines °Take over-the-counter and prescription medicines only as told by your health care provider. °If you were given eye drops, use them exactly as told. It is likely that you will need to use this medicine for the rest of your life. °General instructions °Get regular exercise, but talk with your health care provider about which types   of exercise are safe for you. You may need to avoid yoga or other types of exercise that may involve standing on your head. °Keep all follow-up visits. This is important. °Contact a health care provider if: °You have new symptoms or your symptoms get worse. °Get help right  away if: °You have severe pain in your affected eye. °You have problems with your vision. °You have a bad headache in the area around your eye. °You develop nausea and vomiting. °The same or similar symptoms develop in your other eye. °Summary °Glaucoma is a condition that is caused mainly by high pressure inside the eye. The increased pressure can cause damage to the nerves of the eye, and that can result in loss of vision. °It is important to diagnose glaucoma early before damage occurs. Early treatment can often prevent vision loss. °Open-angle glaucoma often causes no symptoms early on. If it is not treated, the condition will get worse and may cause partial or total loss of vision. °Because glaucoma often does not cause symptoms until late in the disease, it is often found during a routine eye exam. Be sure to have your eyes checked regularly. °This information is not intended to replace advice given to you by your health care provider. Make sure you discuss any questions you have with your health care provider. °Document Revised: 02/10/2020 Document Reviewed: 02/10/2020 °Elsevier Patient Education © 2022 [employer] ° °
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
## 2021-09-11 NOTE — Patient Instructions (Addendum)
Asthma Continue Air Duo 323-1 puff twice a day to prevent cough or wheeze Continue montelukast 10 mg once a day to prevent cough or wheeze Continue albuterol 2 puffs once every 4 hours as needed for cough or wheeze You may use albuterol 2 puffs 5 to 15 minutes before activity to decrease cough or wheeze Based on lab tests from 1 year ago, you qualify for a biologic medication to control your asthma.  We will send your information to our biologic coordinator, Tammy.  She will contact you with further information.  Allergic rhinitis Continue allergen avoidance measures directed toward tree pollen, weed pollen, cat, dog, and dust mites as listed below Continue cetirizine 10 mg once a day as needed for runny nose or itch. Remember to rotate to a different antihistamine about every 3 months. Some examples of over the counter antihistamines include Zyrtec (cetirizine), Xyzal (levocetirizine), Allegra (fexofenadine), and Claritin (loratidine).  Begin Flonase 2 sprays in each nostril once a day as needed for stuffy nose.  In the right nostril, point the applicator out toward the right ear. In the left nostril, point the applicator out toward the left ear Consider saline nasal rinses as needed for nasal symptoms. Use this before any medicated nasal sprays for best result Allergen immunotherapy information packet provided.  Call the clinic if you are interested in beginning allergy injections.  Reflux Continue dietary and lifestyle modifications as listed below  Call the clinic if this treatment plan is not working well for you.  Follow up in 2 months or sooner if needed.  Reducing Pollen Exposure The American Academy of Allergy, Asthma and Immunology suggests the following steps to reduce your exposure to pollen during allergy seasons. Do not hang sheets or clothing out to dry; pollen may collect on these items. Do not mow lawns or spend time around freshly cut grass; mowing stirs up pollen. Keep  windows closed at night.  Keep car windows closed while driving. Minimize morning activities outdoors, a time when pollen counts are usually at their highest. Stay indoors as much as possible when pollen counts or humidity is high and on windy days when pollen tends to remain in the air longer. Use air conditioning when possible.  Many air conditioners have filters that trap the pollen spores. Use a HEPA room air filter to remove pollen form the indoor air you breathe.  Control of Dog or Cat Allergen Avoidance is the best way to manage a dog or cat allergy. If you have a dog or cat and are allergic to dog or cats, consider removing the dog or cat from the home. If you have a dog or cat but dont want to find it a new home, or if your family wants a pet even though someone in the household is allergic, here are some strategies that may help keep symptoms at bay:  Keep the pet out of your bedroom and restrict it to only a few rooms. Be advised that keeping the dog or cat in only one room will not limit the allergens to that room. Dont pet, hug or kiss the dog or cat; if you do, wash your hands with soap and water. High-efficiency particulate air (HEPA) cleaners run continuously in a bedroom or living room can reduce allergen levels over time. Regular use of a high-efficiency vacuum cleaner or a central vacuum can reduce allergen levels. Giving your dog or cat a bath at least once a week can reduce airborne allergen.   Control of  Dust Mite Allergen Dust mites play a major role in allergic asthma and rhinitis. They occur in environments with high humidity wherever human skin is found. Dust mites absorb humidity from the atmosphere (ie, they do not drink) and feed on organic matter (including shed human and animal skin). Dust mites are a microscopic type of insect that you cannot see with the naked eye. High levels of dust mites have been detected from mattresses, pillows, carpets, upholstered  furniture, bed covers, clothes, soft toys and any woven material. The principal allergen of the dust mite is found in its feces. A gram of dust may contain 1,000 mites and 250,000 fecal particles. Mite antigen is easily measured in the air during house cleaning activities. Dust mites do not bite and do not cause harm to humans, other than by triggering allergies/asthma.  Ways to decrease your exposure to dust mites in your home:  1. Encase mattresses, box springs and pillows with a mite-impermeable barrier or cover  2. Wash sheets, blankets and drapes weekly in hot water (130 F) with detergent and dry them in a dryer on the hot setting.  3. Have the room cleaned frequently with a vacuum cleaner and a damp dust-mop. For carpeting or rugs, vacuuming with a vacuum cleaner equipped with a high-efficiency particulate air (HEPA) filter. The dust mite allergic individual should not be in a room which is being cleaned and should wait 1 hour after cleaning before going into the room.  4. Do not sleep on upholstered furniture (eg, couches).  5. If possible removing carpeting, upholstered furniture and drapery from the home is ideal. Horizontal blinds should be eliminated in the rooms where the person spends the most time (bedroom, study, television room). Washable vinyl, roller-type shades are optimal.  6. Remove all non-washable stuffed toys from the bedroom. Wash stuffed toys weekly like sheets and blankets above.  7. Reduce indoor humidity to less than 50%. Inexpensive humidity monitors can be purchased at most hardware stores. Do not use a humidifier as can make the problem worse and are not recommended.

## 2021-09-13 ENCOUNTER — Encounter: Payer: Self-pay | Admitting: *Deleted

## 2021-09-13 ENCOUNTER — Telehealth: Payer: Self-pay | Admitting: *Deleted

## 2021-09-13 NOTE — Telephone Encounter (Signed)
-----   Message from Hetty Blend, FNP sent at 09/11/2021  4:42 PM EST ----- Hi there Cody Waters, Can you please submit this patient for Dupixent or Harrington Challenger for asthma control please? Thank you

## 2021-09-13 NOTE — Telephone Encounter (Signed)
My chart message sent to patient to contact me

## 2021-09-20 NOTE — Telephone Encounter (Signed)
Tried to contact patient but unable to leave message

## 2021-09-25 ENCOUNTER — Telehealth: Payer: Self-pay | Admitting: Family Medicine

## 2021-09-25 NOTE — Telephone Encounter (Signed)
Called pt in regards to referral. Pt stated he attempted to call number provided but was disconnected. Pt was advise to call [phone]

## 2021-09-25 NOTE — Telephone Encounter (Signed)
Patient called Dr.Gould office.  Office stated they have not received referral and/or notes regarding this patient.   Sherri, Can you please resend info for this patient?   Thank you & Happy New Year! Annabelle Harman

## 2021-09-25 NOTE — Telephone Encounter (Signed)
Pt called about referral request for his eye Wanting to know the status of referral--KR  Gave pt info on Dr. Elise Benne, MD (ophthalmology) [phone]

## 2021-09-28 NOTE — Telephone Encounter (Signed)
Patient called Dr. Dellia Nims office for referral appt regarding his eye.  Their first available appt is not until end of March. He found another Opthalmologic who can see him sooner.  Please change referral to Dr. Izell Timberville in [place]. Patient will call them to set up appt once referral has been sent over.

## 2021-10-01 NOTE — Telephone Encounter (Signed)
Noted  

## 2021-10-08 ENCOUNTER — Telehealth: Payer: Self-pay | Admitting: *Deleted

## 2021-10-08 NOTE — Telephone Encounter (Signed)
Can you please call this patient and have him make an appointment for his first allergy injection. Please order epinephrine per protocol. Thank you

## 2021-10-08 NOTE — Telephone Encounter (Signed)
Called and spoke with patient and went over process and length of time with allergy injections. Advised patient that I would mail CPT codes and consent to his home for him to look over, and that if he still wanted to continue he can sign them and bring them in to our office and we will get him scheduled to start injections. Patient verbalized understanding.

## 2021-10-08 NOTE — Telephone Encounter (Addendum)
Called and spoke to patient regarding biologic for his asthma He is wanting to start Harrington Challenger to be given in clinic at this time. I advised I would get approval and submit to [employer] along with copay card and reach out once delivery set to make appt to start therapy.  Patient also advised he want to start allergy injections if someone can check if he has sign the appropiate paperwork and call him to schedule appt

## 2021-10-08 NOTE — Telephone Encounter (Signed)
Called and spoke to patient and will submit for Harrington Challenger and be in touch with patient

## 2021-10-10 ENCOUNTER — Telehealth: Payer: Self-pay | Admitting: *Deleted

## 2021-10-10 NOTE — Telephone Encounter (Signed)
Called and spoke with patient, he has been scheduled for repeat skin testing in [facility] with [facility]mith and has been instructed to stop antihistamines 3 days prior. Patient verbalized understanding.

## 2021-10-12 NOTE — Telephone Encounter (Signed)
Patient Ins denied Cody Waters due to no documentation of at least 2 exacerbations within the last year requiring oral or injectable corticosteroids as defined as uncontrolled asthma

## 2021-10-15 NOTE — Telephone Encounter (Signed)
Thank you :)

## 2021-10-19 ENCOUNTER — Other Ambulatory Visit: Payer: Self-pay | Admitting: Ophthalmology

## 2021-10-19 DIAGNOSIS — H0520 Unspecified exophthalmos: Secondary | ICD-10-CM

## 2021-10-23 NOTE — Telephone Encounter (Signed)
Patient was calling to check on the status of him starting Harrington Challenger, I provided the patient with your phone number so he could reach out and speak with you about it.

## 2021-10-23 NOTE — Telephone Encounter (Signed)
L/M for patient to contact me to discuss denial and next step

## 2021-10-25 NOTE — Telephone Encounter (Signed)
Patient called and was advised per [employer] needs documentation of uncontrolled asthma defined by two or more exacerbations requiring oral steroids in last 12 months so going forward if he has flare up reach out to out office

## 2021-11-01 ENCOUNTER — Other Ambulatory Visit: Payer: Self-pay

## 2021-11-01 ENCOUNTER — Encounter: Payer: Self-pay | Admitting: Family Medicine

## 2021-11-01 ENCOUNTER — Ambulatory Visit: Payer: BC Managed Care – PPO | Admitting: Family Medicine

## 2021-11-01 VITALS — BP 120/80 | HR 59 | Temp 97.6°F | Resp 16

## 2021-11-01 DIAGNOSIS — K219 Gastro-esophageal reflux disease without esophagitis: Secondary | ICD-10-CM

## 2021-11-01 DIAGNOSIS — J3089 Other allergic rhinitis: Secondary | ICD-10-CM

## 2021-11-01 DIAGNOSIS — J4540 Moderate persistent asthma, uncomplicated: Secondary | ICD-10-CM

## 2021-11-01 DIAGNOSIS — J302 Other seasonal allergic rhinitis: Secondary | ICD-10-CM

## 2021-11-01 MED ORDER — BREZTRI AEROSPHERE 160-9-4.8 MCG/ACT IN AERO: 2.0000 | INHALATION_SPRAY | Freq: Two times a day (BID) | RESPIRATORY_TRACT | 2 refills | Status: DC

## 2021-11-01 NOTE — Progress Notes (Signed)
[address] [place] [address] Dept: [phone]  [name]  Patient ID: Cody Waters, male    DOB: 2000/12/13  Age: 21 y.o. MRN: [id number] Date of Office Visit: 11/01/2021  Assessment  Chief Complaint: Allergy Testing  HPI Cody Waters is a 21 year old male who presents the clinic for follow-up visit.  He was last seen in this clinic on 09/11/2021 by [name], FNP, for evaluation of asthma, allergic rhinitis, and reflux.  At today's visit, he reports his asthma has been poorly controlled with symptoms including shortness of breath occurring intermittently, wheezing occurring in the daytime or nighttime, and cough producing mucus occurring occasionally.  He continues AirDuo 232-1 puff twice a day and is using albuterol about 2-3 times a day with moderate relief of symptoms.  Allergic rhinitis is reported as poorly controlled with symptoms including clear rhinorrhea, nasal congestion, sneezing, and postnasal drainage.  He does report the symptoms are year-round with seasonal flares occurring in the spring and fall seasons.  He continues an antihistamine once a day and is not currently using Flonase or nasal saline rinses.  He does report pruritus occurring on his arms and chest for which he continues an antihistamine with relief of symptoms.  Allergic conjunctivitis is reported as moderately well controlled with occasional itching for which he is not currently using an allergy eyedrop.  Reflux is reported as well controlled with rare incidences of heartburn for which he takes Tums with relief of symptoms.  He continues dietary and lifestyle modifications to control heartburn at this time.  His current medications are listed in the chart.   Drug Allergies:  Allergies  Allergen Reactions   Lactose Intolerance (Gi)    Other     cat, dogs, ragweed and pollen    Physical Exam: BP 100/80 (BP Location: Right Arm, Patient Position: Sitting, Cuff Size: Normal)    Pulse (!) 59     Temp 97.6 F (36.4 C) (Temporal)    Resp 16    SpO2 97%    Physical Exam Vitals reviewed.  Constitutional:      Appearance: Normal appearance.  HENT:     Head: Normocephalic and atraumatic.     Right Ear: Tympanic membrane normal.     Left Ear: Tympanic membrane normal.     Nose:     Comments: Bilateral nares slightly erythematous with clear nasal drainage noted.  Pharynx normal.  Ears normal.  Eyes normal.    Mouth/Throat:     Pharynx: Oropharynx is clear.  Eyes:     Conjunctiva/sclera: Conjunctivae normal.  Cardiovascular:     Rate and Rhythm: Normal rate and regular rhythm.     Heart sounds: Normal heart sounds. No murmur heard. Pulmonary:     Effort: Pulmonary effort is normal.     Breath sounds: Normal breath sounds.  Musculoskeletal:        General: Normal range of motion.     Cervical back: Normal range of motion and neck supple.  Skin:    General: Skin is warm and dry.  Neurological:     Mental Status: He is alert and oriented to person, place, and time.  Psychiatric:        Mood and Affect: Mood normal.        Behavior: Behavior normal.        Thought Content: Thought content normal.        Judgment: Judgment normal.    Diagnostics: FVC 4.18, FEV1 3.37.  Predicted FVC  5.62, predicted FEV1 4.69.  Spirometry indicates mild restriction.  Postbronchodilator FVC 5.10, FEV1 4.01.  Postbronchodilator spirometry indicates normal ventilatory function with 19% improvement in FEV1.  Assessment and Plan: 1. Not well controlled moderate persistent asthma   2. Seasonal and perennial allergic rhinitis   3. Gastroesophageal reflux disease, unspecified whether esophagitis present     Meds ordered this encounter  Medications   BREZTRI AEROSPHERE 160-9-4.8 MCG/ACT AERO    Sig: Inhale 2 puffs into the lungs in the morning and at bedtime.    Dispense:  10.7 g    Refill:  2    Patient Instructions  Asthma Begin Breztri 2 puffs twice a day with a spacer to prevent cough or  wheeze. This will replace AirDuo 232 Continue montelukast 10 mg once a day to prevent cough or wheeze Continue albuterol 2 puffs once every 4 hours as needed for cough or wheeze You may use albuterol 2 puffs 5 to 15 minutes before activity to decrease cough or wheeze  Allergic rhinitis Your environmental skin testing was positive to weed pollen, tree pollen, mold, cat, dog, and dust mites. Allergen avoidance measures are listed below Continue cetirizine 10 mg once a day as needed for runny nose or itch. Remember to rotate to a different antihistamine about every 3 months. Some examples of over the counter antihistamines include Zyrtec (cetirizine), Xyzal (levocetirizine), Allegra (fexofenadine), and Claritin (loratidine).  Begin Flonase 2 sprays in each nostril once a day as needed for stuffy nose.  In the right nostril, point the applicator out toward the right ear. In the left nostril, point the applicator out toward the left ear Consider saline nasal rinses as needed for nasal symptoms. Use this before any medicated nasal sprays for best result If your symptoms are not well controlled with the treatment plan as as listed above, consider allergen immunotherapy  Reflux Continue dietary and lifestyle modifications as listed below  Call the clinic if this treatment plan is not working well for you.  Follow up in 2 months or sooner if needed.   Return in about 2 months (around 12/30/2021), or if symptoms worsen or fail to improve.    Thank you for the opportunity to care for this patient.  Please do not hesitate to contact me with questions.  [name], FNP [facility]

## 2021-11-01 NOTE — Patient Instructions (Addendum)
Asthma Begin Breztri 2 puffs twice a day with a spacer to prevent cough or wheeze. This will replace AirDuo 232 Continue montelukast 10 mg once a day to prevent cough or wheeze Continue albuterol 2 puffs once every 4 hours as needed for cough or wheeze You may use albuterol 2 puffs 5 to 15 minutes before activity to decrease cough or wheeze  Allergic rhinitis Your environmental skin testing was positive to weed pollen, tree pollen, mold, cat, dog, and dust mites. Allergen avoidance measures are listed below Continue cetirizine 10 mg once a day as needed for runny nose or itch. Remember to rotate to a different antihistamine about every 3 months. Some examples of over the counter antihistamines include Zyrtec (cetirizine), Xyzal (levocetirizine), Allegra (fexofenadine), and Claritin (loratidine).  Begin Flonase 2 sprays in each nostril once a day as needed for stuffy nose.  In the right nostril, point the applicator out toward the right ear. In the left nostril, point the applicator out toward the left ear Consider saline nasal rinses as needed for nasal symptoms. Use this before any medicated nasal sprays for best result If your symptoms are not well controlled with the treatment plan as as listed above, consider allergen immunotherapy  Reflux Continue dietary and lifestyle modifications as listed below  Call the clinic if this treatment plan is not working well for you.  Follow up in 2 months or sooner if needed.  Reducing Pollen Exposure The American Academy of All[employer], Asthma and Immunology suggests the following steps to reduce your exposure to pollen during allergy seasons. Do not hang sheets or clothing out to dry; pollen may collect on these items. Do not mow lawns or spend time around freshly cut grass; mowing stirs up pollen. Keep windows closed at night.  Keep car windows closed while driving. Minimize morning activities outdoors, a time when pollen counts are usually at their  highest. Stay indoors as much as possible when pollen counts or humidity is high and on windy days when pollen tends to remain in the air longer. Use air conditioning when possible.  Many air conditioners have filters that trap the pollen spores. Use a HEPA room air filter to remove pollen form the indoor air you breathe.  Control of Dog or Cat Allergen Avoidance is the best way to manage a dog or cat allergy. If you have a dog or cat and are allergic to dog or cats, consider removing the dog or cat from the home. If you have a dog or cat but dont want to find it a new home, or if your family wants a pet even though someone in the household is allergic, here are some strategies that may help keep symptoms at bay:  Keep the pet out of your bedroom and restrict it to only a few rooms. Be advised that keeping the dog or cat in only one room will not limit the allergens to that room. Dont pet, hug or kiss the dog or cat; if you do, wash your hands with soap and water. High-efficiency particulate air (HEPA) cleaners run continuously in a bedroom or living room can reduce allergen levels over time. Regular use of a high-efficiency vacuum cleaner or a central vacuum can reduce allergen levels. Giving your dog or cat a bath at least once a week can reduce airborne allergen.   Control of Dust Mite Allergen Dust mites play a major role in allergic asthma and rhinitis. They occur in environments with high humidity wherever human skin  is found. Dust mites absorb humidity from the atmosphere (ie, they do not drink) and feed on organic matter (including shed human and animal skin). Dust mites are a microscopic type of insect that you cannot see with the naked eye. High levels of dust mites have been detected from mattresses, pillows, carpets, upholstered furniture, bed covers, clothes, soft toys and any woven material. The principal allergen of the dust mite is found in its feces. A gram of dust may contain  1,000 mites and 250,000 fecal particles. Mite antigen is easily measured in the air during house cleaning activities. Dust mites do not bite and do not cause harm to humans, other than by triggering allergies/asthma.  Ways to decrease your exposure to dust mites in your home:  1. Encase mattresses, box springs and pillows with a mite-impermeable barrier or cover  2. Wash sheets, blankets and drapes weekly in hot water (130 F) with detergent and dry them in a dryer on the hot setting.  3. Have the room cleaned frequently with a vacuum cleaner and a damp dust-mop. For carpeting or rugs, vacuuming with a vacuum cleaner equipped with a high-efficiency particulate air (HEPA) filter. The dust mite allergic individual should not be in a room which is being cleaned and should wait 1 hour after cleaning before going into the room.  4. Do not sleep on upholstered furniture (eg, couches).  5. If possible removing carpeting, upholstered furniture and drapery from the home is ideal. Horizontal blinds should be eliminated in the rooms where the person spends the most time (bedroom, study, television room). Washable vinyl, roller-type shades are optimal.  6. Remove all non-washable stuffed toys from the bedroom. Wash stuffed toys weekly like sheets and blankets above.  7. Reduce indoor humidity to less than 50%. Inexpensive humidity monitors can be purchased at most hardware stores. Do not use a humidifier as can make the problem worse and are not recommended.

## 2021-11-12 ENCOUNTER — Inpatient Hospital Stay: Admission: RE | Admit: 2021-11-12 | Payer: BC Managed Care – PPO | Source: Ambulatory Visit

## 2021-11-16 ENCOUNTER — Encounter: Payer: Self-pay | Admitting: Family Medicine

## 2021-11-16 DIAGNOSIS — F320 Major depressive disorder, single episode, mild: Secondary | ICD-10-CM

## 2021-11-16 DIAGNOSIS — F514 Sleep terrors [night terrors]: Secondary | ICD-10-CM

## 2021-11-16 NOTE — Telephone Encounter (Signed)
Referral placed. Please make pt aware it can take some time to get in to new psychologists. They will call him to schedule

## 2021-11-16 NOTE — Telephone Encounter (Signed)
Please advise on referral preference if any.

## 2021-11-16 NOTE — Addendum Note (Signed)
Addended by: Felix Pacini A on: 11/16/2021 03:09 PM   Modules accepted: Orders

## 2021-11-16 NOTE — Telephone Encounter (Signed)
Please clarify with patient what he is requesting? Is he requesting services through [facility] psychology/psychiatry?  Or is he asking to have this provider take over management?   He is asking for a referral, please place referral for him.  Will need diagnostic code/condition he is seeking treatment for.  If he is asking I take over management, please make sure he is aware primary care doctors do not provide counseling, but can discuss medications if necessary.

## 2021-11-16 NOTE — Telephone Encounter (Signed)
Please advise if this okay, prior to having pt schedule appt.

## 2021-12-20 ENCOUNTER — Encounter: Payer: Self-pay | Admitting: Family Medicine

## 2021-12-20 ENCOUNTER — Ambulatory Visit (INDEPENDENT_AMBULATORY_CARE_PROVIDER_SITE_OTHER): Payer: BC Managed Care – PPO | Admitting: Family Medicine

## 2021-12-20 VITALS — BP 124/72 | HR 109 | Temp 98.1°F | Ht 69.5 in | Wt 169.0 lb

## 2021-12-20 DIAGNOSIS — Z13220 Encounter for screening for lipoid disorders: Secondary | ICD-10-CM | POA: Diagnosis not present

## 2021-12-20 DIAGNOSIS — Z0000 Encounter for general adult medical examination without abnormal findings: Secondary | ICD-10-CM

## 2021-12-20 DIAGNOSIS — M25562 Pain in left knee: Secondary | ICD-10-CM | POA: Diagnosis not present

## 2021-12-20 DIAGNOSIS — L720 Epidermal cyst: Secondary | ICD-10-CM

## 2021-12-20 DIAGNOSIS — Z8249 Family history of ischemic heart disease and other diseases of the circulatory system: Secondary | ICD-10-CM | POA: Diagnosis not present

## 2021-12-20 DIAGNOSIS — Z113 Encounter for screening for infections with a predominantly sexual mode of transmission: Secondary | ICD-10-CM | POA: Diagnosis not present

## 2021-12-20 DIAGNOSIS — Z79899 Other long term (current) drug therapy: Secondary | ICD-10-CM

## 2021-12-20 DIAGNOSIS — J4540 Moderate persistent asthma, uncomplicated: Secondary | ICD-10-CM | POA: Diagnosis not present

## 2021-12-20 DIAGNOSIS — R29898 Other symptoms and signs involving the musculoskeletal system: Secondary | ICD-10-CM | POA: Diagnosis not present

## 2021-12-20 LAB — COMPREHENSIVE METABOLIC PANEL
ALT: 6 U/L (ref 0–53)
AST: 14 U/L (ref 0–37)
Albumin: 4.9 g/dL (ref 3.5–5.2)
Alkaline Phosphatase: 52 U/L (ref 39–117)
BUN: 14 mg/dL (ref 6–23)
CO2: 27 mEq/L (ref 19–32)
Calcium: 9.4 mg/dL (ref 8.4–10.5)
Chloride: 103 mEq/L (ref 96–112)
Creatinine, Ser: 0.93 mg/dL (ref 0.40–1.50)
GFR: 118.33 mL/min (ref >60.00)
Glucose, Bld: 78 mg/dL (ref 70–99)
Potassium: 3.9 mEq/L (ref 3.5–5.1)
Sodium: 139 mEq/L (ref 135–145)
Total Bilirubin: 0.7 mg/dL (ref 0.2–1.2)
Total Protein: 7 g/dL (ref 6.0–8.3)

## 2021-12-20 LAB — LIPID PANEL
Cholesterol: 187 mg/dL (ref 0–200)
HDL: 41.5 mg/dL (ref >39.00)
LDL Cholesterol: 128 mg/dL — ABNORMAL HIGH (ref 0–99)
NonHDL: 145.1
Total CHOL/HDL Ratio: 4
Triglycerides: 84 mg/dL (ref 0.0–149.0)
VLDL: 16.8 mg/dL (ref 0.0–40.0)

## 2021-12-20 LAB — CBC WITH DIFFERENTIAL/PLATELET
Basophils Absolute: 0 10*3/uL (ref 0.0–0.1)
Basophils Relative: 0.6 % (ref 0.0–3.0)
Eosinophils Absolute: 0.5 10*3/uL (ref 0.0–0.7)
Eosinophils Relative: 10.5 % — ABNORMAL HIGH (ref 0.0–5.0)
HCT: 46.9 % (ref 39.0–52.0)
Hemoglobin: 15.7 g/dL (ref 13.0–17.0)
Lymphocytes Relative: 41.3 % (ref 12.0–46.0)
Lymphs Abs: 1.9 10*3/uL (ref 0.7–4.0)
MCHC: 33.5 g/dL (ref 30.0–36.0)
MCV: 86.1 fl (ref 78.0–100.0)
Monocytes Absolute: 0.2 10*3/uL (ref 0.1–1.0)
Monocytes Relative: 4.1 % (ref 3.0–12.0)
Neutro Abs: 2 10*3/uL (ref 1.4–7.7)
Neutrophils Relative %: 43.5 % (ref 43.0–77.0)
Platelets: 176 10*3/uL (ref 150.0–400.0)
RBC: 5.45 Mil/uL (ref 4.22–5.81)
RDW: 12.6 % (ref 11.5–14.6)
WBC: 4.6 10*3/uL (ref 4.5–10.5)

## 2021-12-20 NOTE — Patient Instructions (Signed)
?Great to see you today.  ?I have refilled the medication(s) we provide.  ? ?If labs were collected, we will inform you of lab results once received either by echart message or telephone call.  ? - echart message- for normal results that have been seen by the patient already.  ? - telephone call: abnormal results or if patient has not viewed results in their echart. ? ?Preventive Care 56-21 Years Old, Male ?Preventive care refers to lifestyle choices and visits with your health care provider that can promote health and wellness. At this stage in your life, you may start seeing a primary care physician instead of a pediatrician for your preventive care. Preventive care visits are also called wellness exams. ?What can I expect for my preventive care visit? ?Counseling ?During your preventive care visit, your health care provider may ask about your: ?Medical history, including: ?Past medical problems. ?Family medical history. ?Current health, including: ?Home life and relationship well-being. ?Emotional well-being. ?Sexual activity and sexual health. ?Lifestyle, including: ?Alcohol, nicotine or tobacco, and drug use. ?Access to firearms. ?Diet, exercise, and sleep habits. ?Sunscreen use. ?Motor vehicle safety. ?Physical exam ?Your health care provider may check your: ?Height and weight. These may be used to calculate your BMI (body mass index). BMI is a measurement that tells if you are at a healthy weight. ?Waist circumference. This measures the distance around your waistline. This measurement also tells if you are at a healthy weight and may help predict your risk of certain diseases, such as type 2 diabetes and high blood pressure. ?Heart rate and blood pressure. ?Body temperature. ?Skin for abnormal spots. ?What immunizations do I need? ?Vaccines are usually given at various ages, according to a schedule. Your health care provider will recommend vaccines for you based on your age, medical history, and lifestyle or  other factors, such as travel or where you work. ?What tests do I need? ?Screening ?Your health care provider may recommend screening tests for certain conditions. This may include: ?Vision and hearing tests. ?Lipid and cholesterol levels. ?Hepatitis B test. ?Hepatitis C test. ?HIV (human immunodeficiency virus) test. ?STI (sexually transmitted infection) testing, if you are at risk. ?Tuberculosis skin test. ?Talk with your health care provider about your test results, treatment options, and if necessary, the need for more tests. ?Follow these instructions at home: ?Eating and drinking ? ?Eat a healthy diet that includes fresh fruits and vegetables, whole grains, lean protein, and low-fat dairy products. ?Drink enough fluid to keep your urine pale yellow. ?Do not drink alcohol if: ?Your health care provider tells you not to drink. ?You are under the legal drinking age. In the [place] the legal drinking age is 21 ?If you drink alcohol: ?Limit how much you have to 0-2 drinks a day. ?Know how much alcohol is in your drink. In the [place] one drink equals one 12 oz bottle of beer (355 mL), one 5 oz glass of wine (148 mL), or one 1? oz glass of hard liquor (44 mL). ?Lifestyle ?Brush your teeth every morning and night with fluoride toothpaste. Floss one time each day. ?Exercise for at least 30 minutes 5 or more days of the week. ?Do not use any products that contain nicotine or tobacco. These products include cigarettes, chewing tobacco, and vaping devices, such as e-cigarettes. If you need help quitting, ask your health care provider. ?Do not use drugs. ?If you are sexually active, practice safe sex. Use a condom or other form of protection to prevent STIs. ?Find healthy  ways to manage stress, such as: ?Meditation, yoga, or listening to music. ?Journaling. ?Talking to a trusted person. ?Spending time with friends and family. ?Safety ?Always wear your seat belt while driving or riding in a vehicle. ?Do not drive: ?If you  have been drinking alcohol. Do not ride with someone who has been drinking. ?When you are tired or distracted. ?While texting. ?If you have been using any mind-altering substances or drugs. ?Wear a helmet and other protective equipment during sports activities. ?If you have firearms in your house, make sure you follow all gun safety procedures. ?Seek help if you have been bullied, physically abused, or sexually abused. ?Use the internet responsibly to avoid dangers, such as online bullying and online sex predators. ?What's next? ?Go to your health care provider once a year for an annual wellness visit. ?Ask your health care provider how often you should have your eyes and teeth checked. ?Stay up to date on all vaccines. ?This information is not intended to replace advice given to you by your health care provider. Make sure you discuss any questions you have with your health care provider. ?Document Revised: 03/07/2021 Document Reviewed: 03/07/2021 ?Elsevier Patient Education ? [place]. ? ?
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
## 2021-12-30 ENCOUNTER — Encounter: Payer: Self-pay | Admitting: Family Medicine

## 2022-01-07 ENCOUNTER — Encounter: Payer: Self-pay | Admitting: Family Medicine

## 2022-01-07 ENCOUNTER — Encounter: Payer: BC Managed Care – PPO | Admitting: Family Medicine

## 2022-01-07 NOTE — Progress Notes (Signed)
Pt cancelled appt during appt call. Pt stated he want to cancel appt since sx are improved and is driving at the moment. Pt is aware of [place] policy.  ?

## 2022-01-09 ENCOUNTER — Telehealth (INDEPENDENT_AMBULATORY_CARE_PROVIDER_SITE_OTHER): Payer: BC Managed Care – PPO | Admitting: Family Medicine

## 2022-01-09 ENCOUNTER — Encounter: Payer: Self-pay | Admitting: Family Medicine

## 2022-01-09 DIAGNOSIS — R6884 Jaw pain: Secondary | ICD-10-CM | POA: Diagnosis not present

## 2022-01-09 DIAGNOSIS — R29898 Other symptoms and signs involving the musculoskeletal system: Secondary | ICD-10-CM | POA: Diagnosis not present
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
## 2022-01-16 ENCOUNTER — Ambulatory Visit (INDEPENDENT_AMBULATORY_CARE_PROVIDER_SITE_OTHER): Payer: BC Managed Care – PPO | Admitting: Psychology

## 2022-01-16 ENCOUNTER — Encounter: Payer: Self-pay | Admitting: Psychology

## 2022-01-16 DIAGNOSIS — F4310 Post-traumatic stress disorder, unspecified: Secondary | ICD-10-CM | POA: Diagnosis not present

## 2022-01-16 DIAGNOSIS — F900 Attention-deficit hyperactivity disorder, predominantly inattentive type: Secondary | ICD-10-CM | POA: Diagnosis not present
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
## 2022-01-16 NOTE — Progress Notes (Signed)
                Adem Costlow, PhD 

## 2022-01-21 ENCOUNTER — Encounter: Payer: Self-pay | Admitting: Allergy

## 2022-01-21 ENCOUNTER — Other Ambulatory Visit: Payer: Self-pay

## 2022-01-21 MED ORDER — ALBUTEROL SULFATE (2.5 MG/3ML) 0.083% IN NEBU: 2.5000 mg | INHALATION_SOLUTION | RESPIRATORY_TRACT | 1 refills | Status: AC | PRN

## 2022-01-30 NOTE — Progress Notes (Addendum)
? ?[name] ? ?RE: [name]: [id number] DOB: 2001-02-20 ?Date of Office Visit: 01/31/2022 ? ?Referring provider: Natalia Leatherwood, DO ?Primary care provider: Natalia Leatherwood, DO ? ?Chief Complaint: Asthma (Breathing is okay just depends on the allergies that day) ? ?History of Present Illness: ?I had the pleasure of seeing Cody Waters for a follow up visit at the [facility] on 01/31/2022. He is a 21 y.o. male, who is being followed for asthma, allergic rhinitis and reflux. His previous allergy office visit was on 11/01/2021 with [name], FNP. Today is a regular follow up visit. ? ?Asthma ?Currently taking Breztri 1 puff twice a day.  ?Stopped Singulair 10mg  daily due to concerns for side effects.  ? ?Still using albuterol anywhere from 0 to 5 times per day. On average requires twice a day use.  ? ?Denies any ER/urgent care visits or prednisone use since the last visit. ?He usually tried to avoid going to [facility]. ? ?Interested in starting biologics as he dealt with asthma his whole life and never NOT had issues with it. He seems to get initial benefit from various inhalers then its efficacy wears off over long term use.  ? ?He went to the ER in 2021 twice - once due to albuterol overdose due to poorly controlled asthma and once for asthma exacerbation. ? ?Allergic rhinitis ?Taking benadryl at night with good benefit. ?Having some nasal congestion but not taking any nasal sprays at this time. ? ?Assessment and Plan: ?[name] is a 21 y.o. male with: ?Not well controlled severe persistent asthma ?Past history - Diagnosed with asthma about 10 years ago.  Worsening symptoms the last 2 to 3 months requiring daily albuterol use.  Episodes of lightheadedness, nausea, hallucination, vision change.  ER visit twice in November due to this and using albuterol over 30 times causing palpitations. 2021 spirometry showed moderate obstruction with 28% improvement in FEV1 post bronchodilator  treatment.  Clinically feeling much improved. ?Interim history - Doing better but still using albuterol on a daily basis (sometimes up to 5 times per day!). March 2023 - eos 500. ?Spirometry today was normal.  ?Will reach out to biologics coordinator regarding starting a biologics for asthma due to poorly controlled status. ?Daily controller medication(s): Increase Breztri to 2 puffs twice a day with spacer and rinse mouth afterwards. ?HOLD montelukast for now.  ?May use albuterol rescue inhaler 2 puffs or nebulizer every 4 to 6 hours as needed for shortness of breath, chest tightness, coughing, and wheezing. May use albuterol rescue inhaler 2 puffs 5 to 15 minutes prior to strenuous physical activities. Monitor frequency of use.  ?Get spirometry at next visit. ?Asthma must be in better control before starting AIT. ? ?Seasonal and perennial allergic rhinitis ?Past history - Perennial rhinitis symptoms and takes Zyrtec with some benefit.  Unsure if Singulair was effective.  2015 blood work was positive to dust mites, cat, dog and tree. 1 cat at home. 2021 Bloodwork positive to dust mites, cat, dog, tree pollen, weed pollen. 2023 skin testing was positive to weed, trees, mold, cat, dog and dust mites. ?Interim history- not controlled. ?Continue environmental control measures as below. ?Start Ryaltris (olopatadine + mometasone nasal spray combination) 1-2 sprays per nostril twice a day. Sample given. ?This will be mailed to you. ?Use over the counter antihistamines such as Zyrtec (cetirizine), Allegra (fexofenadine), or Xyzal (levocetirizine) daily as needed. May take twice a day during allergy flares. May switch antihistamines every  few months. ?Asthma must be in better control before starting allergy immunotherapy. ? ?Return in about 3 months (around 05/03/2022). ? ?Meds ordered this encounter  ?Medications  ? Olopatadine-Mometasone (RYALTRIS) [id number] MCG/ACT SUSP  ?  Sig: Place 1-2 sprays into the nose in the morning  and at bedtime.  ?  Dispense:  29 g  ?  Refill:  5  ?  [id number]  ? ?Lab Orders  ?No laboratory test(s) ordered today  ? ? ?Diagnostics: ?Spirometry:  ?Tracings reviewed. His effort: Good reproducible efforts. ?FVC: 5.56L ?FEV1: 4.74L, 101% predicted ?FEV1/FVC ratio: 85% ?Interpretation: Spirometry consistent with normal pattern.  ?Please see scanned spirometry results for details. ? ?Medication List:  ?Current Outpatient Medications  ?Medication Sig Dispense Refill  ? albuterol (PROVENTIL) (2.5 MG/3ML) 0.083% nebulizer solution Take 3 mLs (2.5 mg total) by nebulization every 4 (four) hours as needed for wheezing or shortness of breath. 75 mL 1  ? albuterol (VENTOLIN HFA) 108 (90 Base) MCG/ACT inhaler Inhale 2 puffs into the lungs every 4 (four) hours as needed for wheezing or shortness of breath. 18 g 2  ? Budeson-Glycopyrrol-Formoterol (BREZTRI AEROSPHERE) 160-9-4.8 MCG/ACT AERO Inhale 1 puff into the lungs 2 (two) times daily.    ? FLUoxetine (PROZAC) 10 MG capsule Take 10 mg by mouth daily.    ? Olopatadine-Mometasone (RYALTRIS) [id number] MCG/ACT SUSP Place 1-2 sprays into the nose in the morning and at bedtime. 29 g 5  ? prazosin (MINIPRESS) 2 MG capsule Take 2 mg by mouth at bedtime.    ? ?No current facility-administered medications for this visit.  ? ?Allergies: ?Allergies  ?Allergen Reactions  ? Lactose Intolerance (Gi)   ? Other   ?  cat, dogs, ragweed and pollen  ? ?I reviewed his past medical history, social history, family history, and environmental history and no significant changes have been reported from his previous visit. ? ?Review of Systems  ?Constitutional:  Negative for appetite change, chills, fever and unexpected weight change.  ?HENT:  Positive for congestion and rhinorrhea.   ?Eyes:  Negative for itching.  ?Respiratory:  Positive for chest tightness and shortness of breath. Negative for cough and wheezing.   ?Gastrointestinal:  Negative for abdominal pain.  ?Skin:  Negative for rash.   ?Allergic/Immunologic: Positive for environmental allergies.  ?Neurological:  Negative for headaches.  ? ?Objective: ?BP 112/70   Pulse 73   Temp 98.5 ?F (36.9 ?C) (Temporal)   Resp 16   SpO2 97%  ?There is no height or weight on file to calculate BMI. ?Physical Exam ?Vitals and nursing note reviewed.  ?Constitutional:   ?   Appearance: Normal appearance. He is well-developed.  ?HENT:  ?   Head: Normocephalic and atraumatic.  ?   Right Ear: Tympanic membrane and external ear normal.  ?   Left Ear: Tympanic membrane and external ear normal.  ?   Nose: Congestion and rhinorrhea present.  ?   Mouth/Throat:  ?   Mouth: Mucous membranes are moist.  ?   Pharynx: Oropharynx is clear.  ?Eyes:  ?   Conjunctiva/sclera: Conjunctivae normal.  ?Cardiovascular:  ?   Rate and Rhythm: Normal rate and regular rhythm.  ?   Heart sounds: Normal heart sounds. No murmur heard. ?Pulmonary:  ?   Effort: Pulmonary effort is normal.  ?   Breath sounds: Normal breath sounds. No wheezing, rhonchi or rales.  ?Musculoskeletal:  ?   Cervical back: Neck supple.  ?Skin: ?   General: Skin is warm.  ?  Findings: No rash.  ?Neurological:  ?   Mental Status: He is alert and oriented to person, place, and time.  ?Psychiatric:     ?   Behavior: Behavior normal.  ? ?Previous notes and tests were reviewed. ?The plan was reviewed with the patient/family, and all questions/concerned were addressed. ? ?It was my pleasure to see Olegario ShearerLandon today and participate in his care. Please feel free to contact me with any questions or concerns. ? ?Sincerely, ? ?Wyline MoodYoon Eiley Mcginnity, DO ?Allergy & Immunology ? ?[facility] ?[place] office: [phone] ?[place] office: [phone] ?

## 2022-01-31 ENCOUNTER — Ambulatory Visit: Payer: BC Managed Care – PPO | Admitting: Allergy

## 2022-01-31 ENCOUNTER — Encounter: Payer: Self-pay | Admitting: Allergy

## 2022-01-31 VITALS — BP 112/70 | HR 73 | Temp 98.5°F | Resp 16

## 2022-01-31 DIAGNOSIS — J4550 Severe persistent asthma, uncomplicated: Secondary | ICD-10-CM | POA: Diagnosis not present

## 2022-01-31 DIAGNOSIS — J3089 Other allergic rhinitis: Secondary | ICD-10-CM | POA: Diagnosis not present

## 2022-01-31 DIAGNOSIS — J302 Other seasonal allergic rhinitis: Secondary | ICD-10-CM

## 2022-01-31 MED ORDER — RYALTRIS 665-25 MCG/ACT NA SUSP: 1.0000 | Freq: Two times a day (BID) | NASAL | 5 refills | Status: DC

## 2022-01-31 NOTE — Patient Instructions (Addendum)
Asthma ?Will see if you qualify for an injection for asthma - Nucala, Fasenra, Dupixent.  ?Tammy will be in touch with you. ? ?Your breathing test was normal today.  ?Daily controller medication(s): Increase Breztri to 2 puffs twice a day with spacer and rinse mouth afterwards. ?HOLD montelukast for now.  ?May use albuterol rescue inhaler 2 puffs or nebulizer every 4 to 6 hours as needed for shortness of breath, chest tightness, coughing, and wheezing. May use albuterol rescue inhaler 2 puffs 5 to 15 minutes prior to strenuous physical activities. Monitor frequency of use.  ?Asthma control goals:  ?Full participation in all desired activities (may need albuterol before activity) ?Albuterol use two times or less a week on average (not counting use with activity) ?Cough interfering with sleep two times or less a month ?Oral steroids no more than once a year ?No hospitalizations  ? ?Environmental allergies ?Continue environmental control measures as below. ?Bloodwork positive to dust mites, cat, dog, tree pollen, weed pollen. ?Start Ryaltris (olopatadine + mometasone nasal spray combination) 1-2 sprays per nostril twice a day. Sample given. ?This will be mailed to you. ?Use over the counter antihistamines such as Zyrtec (cetirizine), Allegra (fexofenadine), or Xyzal (levocetirizine) daily as needed. May take twice a day during allergy flares. May switch antihistamines every few months. ? ?Follow up in 3 months or sooner if needed.  ? ?Reducing Pollen Exposure ?Pollen seasons: trees (spring), grass (summer) and ragweed/weeds (fall). ?Keep windows closed in your home and car to lower pollen exposure.  ?Install air conditioning in the bedroom and throughout the house if possible.  ?Avoid going out in dry windy days - especially early morning. ?Pollen counts are highest between 5 - 10 AM and on dry, hot and windy days.  ?Save outside activities for late afternoon or after a heavy rain, when pollen levels are lower.  ?Avoid  mowing of grass if you have grass pollen allergy. ?Be aware that pollen can also be transported indoors on people and pets.  ?Dry your clothes in an automatic dryer rather than hanging them outside where they might collect pollen.  ?Rinse hair and eyes before bedtime. ?Pet Allergen Avoidance: ?Contrary to popular opinion, there are no ?hypoallergenic? breeds of dogs or cats. That is because people are not allergic to an animal?s hair, but to an allergen found in the animal's saliva, dander (dead skin flakes) or urine. Pet allergy symptoms typically occur within minutes. For some people, symptoms can build up and become most severe 8 to 12 hours after contact with the animal. People with severe allergies can experience reactions in public places if dander has been transported on the pet owners? clothing. ?Keeping an animal outdoors is only a partial solution, since homes with pets in the yard still have higher concentrations of animal allergens. ?Before getting a pet, ask your allergist to determine if you are allergic to animals. If your pet is already considered part of your family, try to minimize contact and keep the pet out of the bedroom and other rooms where you spend a great deal of time. ?As with dust mites, vacuum carpets often or replace carpet with a hardwood floor, tile or linoleum. ?High-efficiency particulate air (HEPA) cleaners can reduce allergen levels over time. ?While dander and saliva are the source of cat and dog allergens, urine is the source of allergens from rabbits, hamsters, mice and [place] pigs; so ask a non-allergic family member to clean the animal?s cage. ?If you have a pet allergy, talk to  your allergist about the potential for allergy immunotherapy (allergy shots). This strategy can often provide long-term relief. ?Control of House Dust Mite Allergen ?Dust mite allergens are a common trigger of allergy and asthma symptoms. While they can be found throughout the house, these  microscopic creatures thrive in warm, humid environments such as bedding, upholstered furniture and carpeting. ?Because so much time is spent in the bedroom, it is essential to reduce mite levels there.  ?Encase pillows, mattresses, and box springs in special allergen-proof fabric covers or airtight, zippered plastic covers.  ?Bedding should be washed weekly in hot water (130? F) and dried in a hot dryer. Allergen-proof covers are available for comforters and pillows that can?t be regularly washed.  ?Wash the allergy-proof covers every few months. Minimize clutter in the bedroom. Keep pets out of the bedroom.  ?Keep humidity less than 50% by using a dehumidifier or air conditioning. You can buy a humidity measuring device called a hygrometer to monitor this.  ?If possible, replace carpets with hardwood, linoleum, or washable area rugs. If that's not possible, vacuum frequently with a vacuum that has a HEPA filter. ?Remove all upholstered furniture and non-washable window drapes from the bedroom. ?Remove all non-washable stuffed toys from the bedroom.  Wash stuffed toys weekly. ? ?

## 2022-01-31 NOTE — Addendum Note (Signed)
Addended by: Ellamae Sia on: 01/31/2022 12:17 PM ? ? Modules accepted: Orders ? ?

## 2022-01-31 NOTE — Assessment & Plan Note (Signed)
Past history - Perennial rhinitis symptoms and takes Zyrtec with some benefit.  Unsure if Singulair was effective.  2015 blood work was positive to dust mites, cat, dog and tree. 1 cat at home. 2021 Bloodwork positive to dust mites, cat, dog, tree pollen, weed pollen. 2023 skin testing was positive to weed, trees, mold, cat, dog and dust mites. ?Interim history- not controlled. ?? Continue environmental control measures as below. ?? Start Ryaltris (olopatadine + mometasone nasal spray combination) 1-2 sprays per nostril twice a day. Sample given. ?? This will be mailed to you. ?? Use over the counter antihistamines such as Zyrtec (cetirizine), Allegra (fexofenadine), or Xyzal (levocetirizine) daily as needed. May take twice a day during allergy flares. May switch antihistamines every few months. ?? Asthma must be in better control before starting allergy immunotherapy. ?

## 2022-01-31 NOTE — Assessment & Plan Note (Addendum)
Past history - Diagnosed with asthma about 10 years ago.  Worsening symptoms the last 2 to 3 months requiring daily albuterol use.  Episodes of lightheadedness, nausea, hallucination, vision change.  ER visit twice in November due to this and using albuterol over 30 times causing palpitations. 2021 spirometry showed moderate obstruction with 28% improvement in FEV1 post bronchodilator treatment.  Clinically feeling much improved. ?Interim history - Doing better but still using albuterol on a daily basis (sometimes up to 5 times per day!). March 2023 - eos 500. ?? Spirometry today was normal.  ?? Will reach out to biologics coordinator regarding starting a biologics for asthma due to poorly controlled status. ?? Daily controller medication(s): Increase Breztri to 2 puffs twice a day with spacer and rinse mouth afterwards. ?? HOLD montelukast for now.  ?? May use albuterol rescue inhaler 2 puffs or nebulizer every 4 to 6 hours as needed for shortness of breath, chest tightness, coughing, and wheezing. May use albuterol rescue inhaler 2 puffs 5 to 15 minutes prior to strenuous physical activities. Monitor frequency of use.  ?? Get spirometry at next visit. ?? Asthma must be in better control before starting AIT. ?

## 2022-02-04 ENCOUNTER — Telehealth: Payer: Self-pay | Admitting: *Deleted

## 2022-02-04 NOTE — Telephone Encounter (Signed)
-----   Message from Ellamae Sia, DO sent at 01/31/2022 12:05 PM EDT ----- ?Namiah Dunnavant, I would really like to start this patient on biologics (whichever gets approved or has good patient assistance program - Eliberto Ivory, Dupi). Still using albuterol up to 5 times per day at times but on average twice a day. He is on Breztri but asthma not controlled. Saw the note what his insurance is requiring but he did have 2 ER visits in 2021 prior to establishing care - once for albuterol overdose! If I need to do a peer to peer then let me know! ?

## 2022-02-04 NOTE — Telephone Encounter (Signed)
Spoke to patient and advised approval copay card and submit to [employer]. He was very grateful with news. I advised him that would take 3-5 bus day for pharmacy to reach out to him and once delivery set will let him know to make appt in clinic for injection according to his preference for now ?

## 2022-02-06 ENCOUNTER — Ambulatory Visit (INDEPENDENT_AMBULATORY_CARE_PROVIDER_SITE_OTHER): Payer: BC Managed Care – PPO | Admitting: Psychology

## 2022-02-06 ENCOUNTER — Encounter: Payer: Self-pay | Admitting: Psychology

## 2022-02-06 DIAGNOSIS — F900 Attention-deficit hyperactivity disorder, predominantly inattentive type: Secondary | ICD-10-CM

## 2022-02-06 DIAGNOSIS — F4310 Post-traumatic stress disorder, unspecified: Secondary | ICD-10-CM

## 2022-02-06 NOTE — Progress Notes (Signed)
[place] Counselor/Therapist Progress Note ? ?Patient ID: Cody Waters, MRN: [id number],   ? ?Date: 02/06/2022 ? ?Time Spent: 12:00 - 2:00pm  ? ?Treatment Type: Testing ? ?Met with patient for testing session.  Patient was at the clinic and session was conducted from therapist's office in person. ? ?Reported Symptoms: Reason for Visit /Presenting Problem: Patient presents with memory impairment related to a history or trauma and attention deficits.  Patient seeking testing to determine the nature of these memory problems, along with updating his neurocognitive and emotional functioning to help determine the course of therapeutic and psychiatric treatment. ? ?Mental Status Exam: ?Appearance:  Neat and Adequately Groomed Casually dressed   ?Behavior: Appropriate  ?Motor: Normal  ?Speech/Language:  Clear and Coherent and Normal Rate  ?Affect: Appropriate  ?Mood: normal  ?Thought process: Normal  ?Thought content:   WNL  ?Sensory/Perceptual disturbances:   WNL  ?Orientation: oriented to person, place, time/date, and situation  ?Attention: Good  ?Concentration: Fair  ?Memory: WNL  ?Fund of knowledge:  Good  ?Insight:   Good  ?Judgment:  Good  ?Impulse Control: Good  ? ?Risk Assessment: ?Danger to Self:  No ?Self-injurious Behavior: No ?Danger to Others: No ? ?Behavior Observations: Patient was cooperative and displayed good effort. Attention and concentration were adequate overall, although patient exhibited a few instances each of self-correction and asked for several questions to be repeated.  Mood was euthymic with appropriate affect.  The results appear representative of current functioning.   ? ?Subjective: Testing included the K-BIT 2R (0.5 hrs. for testing and scoring) along with the CNS Vital signs (0.75 hrs.), BRIEF-A (0.25 hrs.), and PAI (0.5 hrs.).    ? ?Diagnosis:Attention deficit hyperactivity disorder (ADHD), predominantly inattentive type ? ?PTSD (post-traumatic stress disorder) ? ?Plan:  Testing complete. Report writing to be conducted followed by  interactive feedback next session.   ? ?Rainey Pines, PhD ? ? ? ?

## 2022-02-06 NOTE — Progress Notes (Signed)
                Courtland Coppa, PhD 

## 2022-02-06 NOTE — Progress Notes (Signed)
                Veanna Dower, PhD 

## 2022-02-11 ENCOUNTER — Encounter: Payer: Self-pay | Admitting: Psychology

## 2022-02-11 NOTE — Progress Notes (Signed)
Marek Nghiem is a 21 y.o. male patient Report writing competed ( 2 hrs.).  Interactive feedback to be conducted next session. Report to be attached to the feedback progress note and sent to patient following feedback.     Bryson Dames, PhD

## 2022-03-05 ENCOUNTER — Ambulatory Visit (INDEPENDENT_AMBULATORY_CARE_PROVIDER_SITE_OTHER): Payer: BC Managed Care – PPO | Admitting: Psychology

## 2022-03-05 ENCOUNTER — Encounter: Payer: Self-pay | Admitting: Psychology

## 2022-03-05 DIAGNOSIS — F900 Attention-deficit hyperactivity disorder, predominantly inattentive type: Secondary | ICD-10-CM

## 2022-03-05 DIAGNOSIS — F4310 Post-traumatic stress disorder, unspecified: Secondary | ICD-10-CM | POA: Diagnosis not present
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
## 2022-03-05 NOTE — Progress Notes (Signed)
[facility] Counselor/Therapist Progress Note  Patient ID: Kabeer Hoagland, MRN: [id number],    Date: 03/05/2022  Time Spent: 3-3:45 pm   Treatment Type:  Testing - Feedback Session  Met with patient to review results of testing.  Patient was at home and session was conducted from therapist's office via video conferencing.  Patient verbally consented to telehealth.       Reported Symptoms: Patient presents with memory impairment related to a history or trauma and attention deficits.  Patient seeking testing to determine the nature of these memory problems, along with updating his neurocognitive and emotional functioning to help determine the course of therapeutic and psychiatric treatment.  Subjective: Interactive feedback was conducted (1 hr.).  It was discussed how patient met the criterion for Post Traumatic Stress Disorder and ADHD along with how these conditions affect his ability to attend and complete activities.      Recommendations included discussing results with PCP/new psychiatrist, developing a visual organization system, and continuing individual counseling to help manage anxiety, depressed mood, organization, and trauma symptoms.  Patient expressed agreement with the results and recommendations.     Total Time of Testing: 5 hrs. Testing and Scoring: 2 hrs. Interactive Feedback:1 hr. Report Writing: 2 hrs.    Diagnosis:PTSD (post-traumatic stress disorder)  Attention deficit hyperactivity disorder (ADHD), predominantly inattentive type  Plan: Report to be sent to parent and referring provider.     Rainey Pines, PhD

## 2022-03-05 NOTE — Progress Notes (Signed)
                Charlisa Cham, PhD 

## 2022-03-12 ENCOUNTER — Ambulatory Visit: Payer: BC Managed Care – PPO | Admitting: *Deleted

## 2022-03-12 ENCOUNTER — Telehealth: Payer: Self-pay | Admitting: *Deleted

## 2022-03-12 ENCOUNTER — Ambulatory Visit: Payer: BC Managed Care – PPO | Admitting: Allergy

## 2022-03-12 ENCOUNTER — Encounter: Payer: Self-pay | Admitting: Allergy

## 2022-03-12 VITALS — BP 126/80 | HR 91 | Temp 98.5°F | Resp 18 | Ht 69.0 in | Wt 166.5 lb

## 2022-03-12 DIAGNOSIS — J3089 Other allergic rhinitis: Secondary | ICD-10-CM | POA: Diagnosis not present

## 2022-03-12 DIAGNOSIS — K219 Gastro-esophageal reflux disease without esophagitis: Secondary | ICD-10-CM

## 2022-03-12 DIAGNOSIS — J4550 Severe persistent asthma, uncomplicated: Secondary | ICD-10-CM | POA: Diagnosis not present

## 2022-03-12 DIAGNOSIS — J4540 Moderate persistent asthma, uncomplicated: Secondary | ICD-10-CM

## 2022-03-12 MED ORDER — FAMOTIDINE 20 MG PO TABS: 20.0000 mg | ORAL_TABLET | Freq: Every day | ORAL | 1 refills | Status: AC | PRN

## 2022-03-12 MED ORDER — BENRALIZUMAB 30 MG/ML ~~LOC~~ SOSY
30.0000 mg | PREFILLED_SYRINGE | Freq: Once | SUBCUTANEOUS | Status: AC
  Administered 2022-03-12: 30 mg via SUBCUTANEOUS

## 2022-03-12 NOTE — Patient Instructions (Addendum)
Asthma Your breathing test was normal today.  Start Fasenra injections - given today. Next 2 injections are every 4 weeks then it's every 8 weeks. Daily controller medication(s): Take Breztri 2 puffs twice a day with spacer and rinse mouth afterwards. Continue Singulair (montelukast) 10mg  daily at night. May use albuterol rescue inhaler 2 puffs or nebulizer every 4 to 6 hours as needed for shortness of breath, chest tightness, coughing, and wheezing. May use albuterol rescue inhaler 2 puffs 5 to 15 minutes prior to strenuous physical activities. Monitor frequency of use.  Asthma control goals:  Full participation in all desired activities (may need albuterol before activity) Albuterol use two times or less a week on average (not counting use with activity) Cough interfering with sleep two times or less a month Oral steroids no more than once a year No hospitalizations   Environmental allergies Continue environmental control measures as below. Bloodwork positive to dust mites, cat, dog, tree pollen, weed pollen. Ryaltris (olopatadine + mometasone nasal spray combination) 1-2 sprays per nostril twice a day.  Use over the counter antihistamines such as Zyrtec (cetirizine), Allegra (fexofenadine), or Xyzal (levocetirizine) daily as needed. May take twice a day during allergy flares. May switch antihistamines every few months. Call your insurance about starting allergy shots and codes - 2 shots  Heartburn: See handout for lifestyle and dietary modifications. May take famotidine 20mg  1-2 tablets as needed daily.   Follow up in 3 months or sooner if needed.   Reducing Pollen Exposure Pollen seasons: trees (spring), grass (summer) and ragweed/weeds (fall). Keep windows closed in your home and car to lower pollen exposure.  Install air conditioning in the bedroom and throughout the house if possible.  Avoid going out in dry windy days - especially early morning. Pollen counts are highest  between 5 - 10 AM and on dry, hot and windy days.  Save outside activities for late afternoon or after a heavy rain, when pollen levels are lower.  Avoid mowing of grass if you have grass pollen allergy. Be aware that pollen can also be transported indoors on people and pets.  Dry your clothes in an automatic dryer rather than hanging them outside where they might collect pollen.  Rinse hair and eyes before bedtime. Pet Allergen Avoidance: Contrary to popular opinion, there are no "hypoallergenic" breeds of dogs or cats. That is because people are not allergic to an animal's hair, but to an allergen found in the animal's saliva, dander (dead skin flakes) or urine. Pet allergy symptoms typically occur within minutes. For some people, symptoms can build up and become most severe 8 to 12 hours after contact with the animal. People with severe allergies can experience reactions in public places if dander has been transported on the pet owners' clothing. Keeping an animal outdoors is only a partial solution, since homes with pets in the yard still have higher concentrations of animal allergens. Before getting a pet, ask your allergist to determine if you are allergic to animals. If your pet is already considered part of your family, try to minimize contact and keep the pet out of the bedroom and other rooms where you spend a great deal of time. As with dust mites, vacuum carpets often or replace carpet with a hardwood floor, tile or linoleum. High-efficiency particulate air (HEPA) cleaners can reduce allergen levels over time. While dander and saliva are the source of cat and dog allergens, urine is the source of allergens from rabbits, hamsters, mice and 1982 pigs;  so ask a non-allergic family member to clean the animal's cage. If you have a pet allergy, talk to your allergist about the potential for allergy immunotherapy (allergy shots). This strategy can often provide long-term relief. Control of House  Dust Mite Allergen Dust mite allergens are a common trigger of allergy and asthma symptoms. While they can be found throughout the house, these microscopic creatures thrive in warm, humid environments such as bedding, upholstered furniture and carpeting. Because so much time is spent in the bedroom, it is essential to reduce mite levels there.  Encase pillows, mattresses, and box springs in special allergen-proof fabric covers or airtight, zippered plastic covers.  Bedding should be washed weekly in hot water (130 F) and dried in a hot dryer. Allergen-proof covers are available for comforters and pillows that can't be regularly washed.  Wash the allergy-proof covers every few months. Minimize clutter in the bedroom. Keep pets out of the bedroom.  Keep humidity less than 50% by using a dehumidifier or air conditioning. You can buy a humidity measuring device called a hygrometer to monitor this.  If possible, replace carpets with hardwood, linoleum, or washable area rugs. If that's not possible, vacuum frequently with a vacuum that has a HEPA filter. Remove all upholstered furniture and non-washable window drapes from the bedroom. Remove all non-washable stuffed toys from the bedroom.  Wash stuffed toys weekly.

## 2022-03-12 NOTE — Assessment & Plan Note (Signed)
Past history - Diagnosed with asthma about 10 years ago.  Worsening symptoms the last 2 to 3 months requiring daily albuterol use.  Episodes of lightheadedness, nausea, hallucination, vision change.  ER visit twice in November due to this and using albuterol over 30 times causing palpitations. 2021 spirometry showed moderate obstruction with 28% improvement in FEV1 post bronchodilator treatment.  Clinically feeling much improved. March 2023 - eos 500. Interim history - using albuterol about once per day which is less than before.  Marland Kitchen Spirometry today was normal.  . Start Fasenra injections - given today, demonstrated proper use for at home administration. o Next 2 injections are every 4 weeks then it's every 8 weeks. . Daily controller medication(s): Take Breztri 2 puffs twice a day with spacer and rinse mouth afterwards. . Continue Singulair (montelukast) 10mg  daily at night. . May use albuterol rescue inhaler 2 puffs or nebulizer every 4 to 6 hours as needed for shortness of breath, chest tightness, coughing, and wheezing. May use albuterol rescue inhaler 2 puffs 5 to 15 minutes prior to strenuous physical activities. Monitor frequency of use.  . Get spirometry at next visit.

## 2022-03-12 NOTE — Progress Notes (Signed)
Immunotherapy   Patient Details  Name: Cody Waters MRN: [id number] Date of Birth: 09/27/2000  03/12/2022  [name] started injections for  Fasenra 30mg /ml. Frequency: Every 4 weeks for 3 injections then every 8 weeks.  Epi-Pen:Epi-Pen Available  Consent signed and patient instructions given. Proper technique and administration shown for self administration at home. Waited in clinic for 30 minutes with no problems.    03/12/2022, 12:19 PM

## 2022-03-12 NOTE — Assessment & Plan Note (Signed)
   See handout for lifestyle and dietary modifications.  May take famotidine 20mg  1-2 tablets as needed daily.

## 2022-03-12 NOTE — Progress Notes (Signed)
[name]  RE: Cody Waters MRN: [id number] DOB: Sep 11, 2001 Date of Office Visit: 03/12/2022  Referring provider: Natalia Leatherwood, DO Primary care provider: Natalia Leatherwood, DO  Chief Complaint: Asthma (Asthma is fine but could be better. ) and Allergic Rhinitis  (He feels like the nasal spray is helping a lot. )  History of Present Illness: I had the pleasure of seeing Cody Waters for a follow up visit at the [facility] on 03/12/2022. He is a 21 y.o. male, who is being followed for asthma, allergic rhinitis. His previous allergy office visit was on 01/31/2022 with Dr. Selena Batten. Today is a regular follow up visit.  Asthma Patient was able to get Harrington Challenger and picked it up from the pharmacy. Interested in at home administration.   Still using albuterol 1-2 puffs once a day for shortness of breath, wheezing.  Taking Breztri 2 puffs 1-2 times a day.  Denies any ER/urgent care visits or prednisone use since the last visit.   Seasonal and perennial allergic rhinitis Taking Ryaltris 1 spray per nostril once a day in the mornings with some benefit. No nosebleeds.  Taking montelukast daily. Tried to call insurance about cost of shots but got nowhere - he will try again.  Noticed heartburn symptoms once a month after eating citrus or tart foods.  Assessment and Plan: Daved is a 21 y.o. male with: Not well controlled severe persistent asthma Past history - Diagnosed with asthma about 10 years ago.  Worsening symptoms the last 2 to 3 months requiring daily albuterol use.  Episodes of lightheadedness, nausea, hallucination, vision change.  ER visit twice in November due to this and using albuterol over 30 times causing palpitations. 2021 spirometry showed moderate obstruction with 28% improvement in FEV1 post bronchodilator treatment.  Clinically feeling much improved. March 2023 - eos 500. Interim history - using albuterol about once per day which is less than before.   Spirometry today was normal.  Start Fasenra injections - given today, demonstrated proper use for at home administration. Next 2 injections are every 4 weeks then it's every 8 weeks. Daily controller medication(s): Take Breztri 2 puffs twice a day with spacer and rinse mouth afterwards. Continue Singulair (montelukast) 10mg  daily at night. May use albuterol rescue inhaler 2 puffs or nebulizer every 4 to 6 hours as needed for shortness of breath, chest tightness, coughing, and wheezing. May use albuterol rescue inhaler 2 puffs 5 to 15 minutes prior to strenuous physical activities. Monitor frequency of use.  Get spirometry at next visit.  Seasonal and perennial allergic rhinitis Past history - Perennial rhinitis symptoms and takes Zyrtec with some benefit.  Unsure if Singulair was effective.  2015 blood work was positive to dust mites, cat, dog and tree. 1 cat at home. 2021 Bloodwork positive to dust mites, cat, dog, tree pollen, weed pollen. 2023 skin testing was positive to weed, trees, mold, cat, dog and dust mites. Interim history - improved with Ryaltris. Continue environmental control measures. Ryaltris (olopatadine + mometasone nasal spray combination) 1-2 sprays per nostril twice a day.  Use over the counter antihistamines such as Zyrtec (cetirizine), Allegra (fexofenadine), or Xyzal (levocetirizine) daily as needed. May take twice a day during allergy flares. May switch antihistamines every few months. Call your insurance about starting allergy shots - 2 shots, codes given. If asthma doing better at next visit then will start allergy shots.   Gastroesophageal reflux disease See handout for lifestyle and dietary modifications. May  take famotidine 20mg  1-2 tablets as needed daily.   Return in about 3 months (around 06/12/2022).  Meds ordered this encounter  Medications   famotidine (PEPCID) 20 MG tablet    Sig: Take 1 tablet (20 mg total) by mouth daily as needed for heartburn or  indigestion.    Dispense:  30 tablet    Refill:  1   Benralizumab SOSY 30 mg   Lab Orders  No laboratory test(s) ordered today    Diagnostics: Spirometry:  Tracings reviewed. His effort: Good reproducible efforts. FVC: 5.47L FEV1: 4.55L, 100% predicted FEV1/FVC ratio: 83% Interpretation: Spirometry consistent with normal pattern.  Please see scanned spirometry results for details.  Medication List:  Current Outpatient Medications  Medication Sig Dispense Refill   albuterol (PROVENTIL) (2.5 MG/3ML) 0.083% nebulizer solution Take 3 mLs (2.5 mg total) by nebulization every 4 (four) hours as needed for wheezing or shortness of breath. 75 mL 1   albuterol (VENTOLIN HFA) 108 (90 Base) MCG/ACT inhaler Inhale 2 puffs into the lungs every 4 (four) hours as needed for wheezing or shortness of breath. 18 g 2   Budeson-Glycopyrrol-Formoterol (BREZTRI AEROSPHERE) 160-9-4.8 MCG/ACT AERO Inhale 1 puff into the lungs 2 (two) times daily.     famotidine (PEPCID) 20 MG tablet Take 1 tablet (20 mg total) by mouth daily as needed for heartburn or indigestion. 30 tablet 1   FASENRA 30 MG/ML SOSY Inject into the skin.     FLUoxetine (PROZAC) 10 MG capsule Take 10 mg by mouth daily.     Olopatadine-Mometasone (RYALTRIS) 06/14/2022 MCG/ACT SUSP Place 1-2 sprays into the nose in the morning and at bedtime. 29 g 5   prazosin (MINIPRESS) 2 MG capsule Take 2 mg by mouth at bedtime.     No current facility-administered medications for this visit.   Allergies: Allergies  Allergen Reactions   Lactose Intolerance (Gi)    Other     cat, dogs, ragweed and pollen   I reviewed his past medical history, social history, family history, and environmental history and no significant changes have been reported from his previous visit.  Review of Systems  Constitutional:  Negative for appetite change, chills, fever and unexpected weight change.  HENT:  Negative for congestion and rhinorrhea.   Eyes:  Negative for  itching.  Respiratory:  Positive for chest tightness, shortness of breath and wheezing. Negative for cough.   Gastrointestinal:  Negative for abdominal pain.  Skin:  Negative for rash.  Allergic/Immunologic: Positive for environmental allergies.  Neurological:  Negative for headaches.    Objective: BP 126/80   Pulse 91   Temp 98.5 F (36.9 C)   Resp 18   Ht 5\' 9"  (1.753 m)   Wt 166 lb 8 oz (75.5 kg)   SpO2 97%   BMI 24.59 kg/m  Body mass index is 24.59 kg/m. Physical Exam Vitals and nursing note reviewed.  Constitutional:      Appearance: Normal appearance. He is well-developed.  HENT:     Head: Normocephalic and atraumatic.     Right Ear: Tympanic membrane and external ear normal.     Left Ear: Tympanic membrane and external ear normal.     Nose: Nose normal.     Mouth/Throat:     Mouth: Mucous membranes are moist.     Pharynx: Oropharynx is clear.  Eyes:     Conjunctiva/sclera: Conjunctivae normal.  Cardiovascular:     Rate and Rhythm: Normal rate and regular rhythm.     Heart sounds:  Normal heart sounds. No murmur heard. Pulmonary:     Effort: Pulmonary effort is normal.     Breath sounds: Normal breath sounds. No wheezing, rhonchi or rales.  Musculoskeletal:     Cervical back: Neck supple.  Skin:    General: Skin is warm.     Findings: No rash.  Neurological:     Mental Status: He is alert and oriented to person, place, and time.  Psychiatric:        Behavior: Behavior normal.   Previous notes and tests were reviewed. The plan was reviewed with the patient/family, and all questions/concerned were addressed.  It was my pleasure to see Tuck today and participate in his care. Please feel free to contact me with any questions or concerns.  Sincerely,  Wyline Mood, DO Allergy & Immunology  [facility] office: [phone] [facility] office: [phone]

## 2022-03-12 NOTE — Assessment & Plan Note (Signed)
Past history - Perennial rhinitis symptoms and takes Zyrtec with some benefit.  Unsure if Singulair was effective.  2015 blood work was positive to dust mites, cat, dog and tree. 1 cat at home. 2021 Bloodwork positive to dust mites, cat, dog, tree pollen, weed pollen. 2023 skin testing was positive to weed, trees, mold, cat, dog and dust mites. Interim history - improved with Ryaltris.  Continue environmental control measures.  Ryaltris (olopatadine + mometasone nasal spray combination) 1-2 sprays per nostril twice a day.   Use over the counter antihistamines such as Zyrtec (cetirizine), Allegra (fexofenadine), or Xyzal (levocetirizine) daily as needed. May take twice a day during allergy flares. May switch antihistamines every few months.  Call your insurance about starting allergy shots - 2 shots, codes given.  If asthma doing better at next visit then will start allergy shots.

## 2022-03-12 NOTE — Telephone Encounter (Signed)
Pt received his first Fasenra injection today and would like to start giving them at home (demo administration done at visit). Can we change from syringe to auto injector?

## 2022-03-18 MED ORDER — FASENRA PEN 30 MG/ML ~~LOC~~ SOAJ: 30.0000 mg | SUBCUTANEOUS | 6 refills | Status: DC

## 2022-03-18 NOTE — Telephone Encounter (Signed)
Called patient and advised new Rx for pen to [facility] and number given to check on same and delivery to patient. If he has any problems advised to reach out to me

## 2022-04-01 ENCOUNTER — Encounter: Payer: Self-pay | Admitting: Family Medicine

## 2022-04-01 DIAGNOSIS — F320 Major depressive disorder, single episode, mild: Secondary | ICD-10-CM

## 2022-04-01 NOTE — Telephone Encounter (Signed)
Please advise on referral

## 2022-04-01 NOTE — Telephone Encounter (Signed)
Ok to place referral.

## 2022-05-09 ENCOUNTER — Ambulatory Visit: Payer: BC Managed Care – PPO | Admitting: Allergy

## 2022-05-14 ENCOUNTER — Ambulatory Visit (HOSPITAL_COMMUNITY): Payer: BC Managed Care – PPO | Admitting: Psychiatry

## 2022-05-14 ENCOUNTER — Encounter (HOSPITAL_COMMUNITY): Payer: Self-pay | Admitting: Psychiatry

## 2022-05-14 VITALS — BP 118/78 | Temp 97.7°F | Ht 69.0 in | Wt 171.0 lb

## 2022-05-14 DIAGNOSIS — F331 Major depressive disorder, recurrent, moderate: Secondary | ICD-10-CM

## 2022-05-14 DIAGNOSIS — F411 Generalized anxiety disorder: Secondary | ICD-10-CM | POA: Diagnosis not present

## 2022-05-14 DIAGNOSIS — F4310 Post-traumatic stress disorder, unspecified: Secondary | ICD-10-CM

## 2022-05-14 DIAGNOSIS — D582 Other hemoglobinopathies: Secondary | ICD-10-CM

## 2022-05-14 MED ORDER — FLUOXETINE HCL 10 MG PO CAPS: 10.0000 mg | ORAL_CAPSULE | Freq: Every day | ORAL | 0 refills | Status: DC

## 2022-05-14 MED ORDER — BUPROPION HCL ER (SR) 100 MG PO TB12: 100.0000 mg | ORAL_TABLET | Freq: Every day | ORAL | 0 refills | Status: DC
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
## 2022-05-22 ENCOUNTER — Other Ambulatory Visit: Payer: Self-pay | Admitting: Family Medicine

## 2022-05-23 ENCOUNTER — Other Ambulatory Visit: Payer: Self-pay

## 2022-05-23 MED ORDER — ALBUTEROL SULFATE HFA 108 (90 BASE) MCG/ACT IN AERS: 2.0000 | INHALATION_SPRAY | RESPIRATORY_TRACT | 2 refills | Status: DC | PRN

## 2022-05-23 NOTE — Telephone Encounter (Signed)
Patient called in stating they needed a refill on the Ventolin Inhaler. I verified the pharmacy and sent in the refill. I also verified last office visit and last refill for ventolin.

## 2022-06-05 ENCOUNTER — Other Ambulatory Visit (HOSPITAL_COMMUNITY): Payer: Self-pay | Admitting: Psychiatry

## 2022-06-25 ENCOUNTER — Encounter (HOSPITAL_COMMUNITY): Payer: Self-pay | Admitting: Psychiatry

## 2022-06-25 ENCOUNTER — Ambulatory Visit (HOSPITAL_COMMUNITY): Payer: BC Managed Care – PPO | Admitting: Psychiatry

## 2022-06-25 VITALS — BP 114/87 | HR 77 | Ht 69.0 in | Wt 163.0 lb

## 2022-06-25 DIAGNOSIS — F900 Attention-deficit hyperactivity disorder, predominantly inattentive type: Secondary | ICD-10-CM | POA: Diagnosis not present

## 2022-06-25 DIAGNOSIS — F331 Major depressive disorder, recurrent, moderate: Secondary | ICD-10-CM

## 2022-06-25 DIAGNOSIS — F411 Generalized anxiety disorder: Secondary | ICD-10-CM

## 2022-06-25 DIAGNOSIS — F4310 Post-traumatic stress disorder, unspecified: Secondary | ICD-10-CM

## 2022-06-25 MED ORDER — FLUOXETINE HCL 10 MG PO CAPS: 10.0000 mg | ORAL_CAPSULE | Freq: Every day | ORAL | 1 refills | Status: AC

## 2022-06-25 MED ORDER — BUPROPION HCL ER (SR) 100 MG PO TB12
100.0000 mg | ORAL_TABLET | Freq: Every day | ORAL | 1 refills | Status: DC
Start: 2022-06-25 — End: 2022-07-29

## 2022-06-25 MED ORDER — LISDEXAMFETAMINE DIMESYLATE 10 MG PO CHEW
10.0000 mg | CHEWABLE_TABLET | Freq: Every day | ORAL | 0 refills | Status: AC
Start: 2022-06-25 — End: 2024-04-19
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
## 2022-07-03 ENCOUNTER — Other Ambulatory Visit (HOSPITAL_COMMUNITY): Payer: Self-pay

## 2022-07-03 NOTE — Progress Notes (Signed)
Nothing needed at this time.  

## 2022-07-12 ENCOUNTER — Telehealth: Payer: Self-pay | Admitting: *Deleted

## 2022-07-12 NOTE — Telephone Encounter (Signed)
L/m for patient to contact clinic to make MD appt for [facility]

## 2022-07-29 ENCOUNTER — Other Ambulatory Visit (HOSPITAL_COMMUNITY): Payer: Self-pay | Admitting: Psychiatry

## 2022-08-27 ENCOUNTER — Ambulatory Visit (HOSPITAL_COMMUNITY): Payer: BC Managed Care – PPO | Admitting: Psychiatry

## 2022-09-02 ENCOUNTER — Encounter (HOSPITAL_COMMUNITY): Payer: Self-pay

## 2022-09-02 ENCOUNTER — Telehealth (HOSPITAL_COMMUNITY): Payer: BC Managed Care – PPO | Admitting: Psychiatry

## 2022-09-04 ENCOUNTER — Encounter (HOSPITAL_COMMUNITY): Payer: Self-pay

## 2022-09-04 ENCOUNTER — Telehealth (HOSPITAL_COMMUNITY): Payer: BC Managed Care – PPO | Admitting: Psychiatry

## 2022-09-06 ENCOUNTER — Encounter (HOSPITAL_COMMUNITY): Payer: Self-pay

## 2022-09-06 ENCOUNTER — Telehealth (HOSPITAL_COMMUNITY): Payer: BC Managed Care – PPO | Admitting: Psychiatry

## 2022-09-09 ENCOUNTER — Telehealth: Payer: Self-pay | Admitting: Family Medicine

## 2022-09-09 NOTE — Telephone Encounter (Signed)
Melvern Banker at [facility] called for Prior Authorization for [facility].  Last written 03/18/22.  LAST OFFICE VISIT: 09/04/2020  [facility] [phone]

## 2022-09-10 NOTE — Telephone Encounter (Signed)
Waiting on patient to come in for appt in order to do PA

## 2022-09-12 ENCOUNTER — Encounter (HOSPITAL_COMMUNITY): Payer: Self-pay | Admitting: Psychiatry

## 2022-09-30 NOTE — Progress Notes (Deleted)
[name]  RE: Cody Waters MRN: [id number] DOB: 2001/03/18 Date of Office Visit: 10/01/2022  Referring provider: Ma Hillock, DO Primary care provider: Ma Hillock, DO  Chief Complaint: No chief complaint on file.  History of Present Illness: I had the pleasure of seeing Cody Waters for a follow up visit at the [facility] [place] on 09/30/2022. He is a 22 y.o. male, who is being followed for asthma on Fasenra, allergic rhinitis and GERD. His previous allergy office visit was on 03/12/2022 with Dr. Maudie Mercury. Today is a regular follow up visit.  Not well controlled severe persistent asthma Past history - Diagnosed with asthma about 10 years ago.  Worsening symptoms the last 2 to 3 months requiring daily albuterol use.  Episodes of lightheadedness, nausea, hallucination, vision change.  ER visit twice in November due to this and using albuterol over 30 times causing palpitations. 2021 spirometry showed moderate obstruction with 28% improvement in FEV1 post bronchodilator treatment.  Clinically feeling much improved. March 2023 - eos 500. Interim history - using albuterol about once per day which is less than before.  Spirometry today was normal.  Start Fasenra injections - given today, demonstrated proper use for at home administration. Next 2 injections are every 4 weeks then it's every 8 weeks. Daily controller medication(s): Take Breztri 2 puffs twice a day with spacer and rinse mouth afterwards. Continue Singulair (montelukast) 10mg  daily at night. May use albuterol rescue inhaler 2 puffs or nebulizer every 4 to 6 hours as needed for shortness of breath, chest tightness, coughing, and wheezing. May use albuterol rescue inhaler 2 puffs 5 to 15 minutes prior to strenuous physical activities. Monitor frequency of use.  Get spirometry at next visit.   Seasonal and perennial allergic rhinitis Past history - Perennial rhinitis symptoms and takes Zyrtec with some benefit.   Unsure if Singulair was effective.  2015 blood work was positive to dust mites, cat, dog and tree. 1 cat at home. 2021 Bloodwork positive to dust mites, cat, dog, tree pollen, weed pollen. 2023 skin testing was positive to weed, trees, mold, cat, dog and dust mites. Interim history - improved with Ryaltris. Continue environmental control measures. Ryaltris (olopatadine + mometasone nasal spray combination) 1-2 sprays per nostril twice a day.  Use over the counter antihistamines such as Zyrtec (cetirizine), Allegra (fexofenadine), or Xyzal (levocetirizine) daily as needed. May take twice a day during allergy flares. May switch antihistamines every few months. Call your insurance about starting allergy shots - 2 shots, codes given. If asthma doing better at next visit then will start allergy shots.    Gastroesophageal reflux disease See handout for lifestyle and dietary modifications. May take famotidine 20mg  1-2 tablets as needed daily.    Return in about 3 months (around 06/12/2022).  Assessment and Plan: Cody Waters is a 22 y.o. male with: No problem-specific Assessment & Plan notes found for this encounter.  No follow-ups on file.  No orders of the defined types were placed in this encounter.  Lab Orders  No laboratory test(s) ordered today    Diagnostics: Spirometry:  Tracings reviewed. His effort: {Blank single:19197::"Good reproducible efforts.","It was hard to get consistent efforts and there is a question as to whether this reflects a maximal maneuver.","Poor effort, data can not be interpreted."} FVC: ***L FEV1: ***L, ***% predicted FEV1/FVC ratio: ***% Interpretation: {Blank single:19197::"Spirometry consistent with mild obstructive disease","Spirometry consistent with moderate obstructive disease","Spirometry consistent with severe obstructive disease","Spirometry consistent with possible restrictive disease","Spirometry consistent  with mixed obstructive and restrictive  disease","Spirometry uninterpretable due to technique","Spirometry consistent with normal pattern","No overt abnormalities noted given today's efforts"}.  Please see scanned spirometry results for details.  Skin Testing: {Blank single:19197::"Select foods","Environmental allergy panel","Environmental allergy panel and select foods","Food allergy panel","None","Deferred due to recent antihistamines use"}. *** Results discussed with patient/family.   Medication List:  Current Outpatient Medications  Medication Sig Dispense Refill   albuterol (PROVENTIL) (2.5 MG/3ML) 0.083% nebulizer solution Take 3 mLs (2.5 mg total) by nebulization every 4 (four) hours as needed for wheezing or shortness of breath. 75 mL 1   albuterol (VENTOLIN HFA) 108 (90 Base) MCG/ACT inhaler Inhale 2 puffs into the lungs every 4 (four) hours as needed for wheezing or shortness of breath. 18 g 2   Benralizumab (FASENRA PEN) 30 MG/ML SOAJ Inject 1 mL (30 mg total) into the skin every 28 (twenty-eight) days. FOR 2 LOADING DOSES FIRST DOSE GIVEN 03/12/22, THEN EVERY 8 WEEKS 1 mL 6   BREZTRI AEROSPHERE 160-9-4.8 MCG/ACT AERO INHALE 2 PUFFS INTO THE LUNGS IN THE MORNING AND AT BEDTIME. 10.7 each 5   buPROPion ER (WELLBUTRIN SR) 100 MG 12 hr tablet TAKE 1 TABLET BY MOUTH EVERY DAY 90 tablet 0   famotidine (PEPCID) 20 MG tablet Take 1 tablet (20 mg total) by mouth daily as needed for heartburn or indigestion. 30 tablet 1   FLUoxetine (PROZAC) 10 MG capsule Take 1 capsule (10 mg total) by mouth daily. 30 capsule 1   Lisdexamfetamine Dimesylate 10 MG CHEW Chew 10 mg by mouth daily. 30 tablet 0   Olopatadine-Mometasone (RYALTRIS) 665-25 MCG/ACT SUSP Place 1-2 sprays into the nose in the morning and at bedtime. 29 g 5   prazosin (MINIPRESS) 2 MG capsule Take 2 mg by mouth at bedtime.     No current facility-administered medications for this visit.   Allergies: Allergies  Allergen Reactions   Lactose Intolerance (Gi)    Other      cat, dogs, ragweed and pollen   I reviewed his past medical history, social history, family history, and environmental history and no significant changes have been reported from his previous visit.  Review of Systems  Constitutional:  Negative for appetite change, chills, fever and unexpected weight change.  HENT:  Negative for congestion and rhinorrhea.   Eyes:  Negative for itching.  Respiratory:  Positive for chest tightness, shortness of breath and wheezing. Negative for cough.   Gastrointestinal:  Negative for abdominal pain.  Skin:  Negative for rash.  Allergic/Immunologic: Positive for environmental allergies.  Neurological:  Negative for headaches.    Objective: There were no vitals taken for this visit. There is no height or weight on file to calculate BMI. Physical Exam Vitals and nursing note reviewed.  Constitutional:      Appearance: Normal appearance. He is well-developed.  HENT:     Head: Normocephalic and atraumatic.     Right Ear: Tympanic membrane and external ear normal.     Left Ear: Tympanic membrane and external ear normal.     Nose: Nose normal.     Mouth/Throat:     Mouth: Mucous membranes are moist.     Pharynx: Oropharynx is clear.  Eyes:     Conjunctiva/sclera: Conjunctivae normal.  Cardiovascular:     Rate and Rhythm: Normal rate and regular rhythm.     Heart sounds: Normal heart sounds. No murmur heard. Pulmonary:     Effort: Pulmonary effort is normal.     Breath sounds: Normal breath  sounds. No wheezing, rhonchi or rales.  Musculoskeletal:     Cervical back: Neck supple.  Skin:    General: Skin is warm.     Findings: No rash.  Neurological:     Mental Status: He is alert and oriented to person, place, and time.  Psychiatric:        Behavior: Behavior normal.    Previous notes and tests were reviewed. The plan was reviewed with the patient/family, and all questions/concerned were addressed.  It was my pleasure to see Cody Waters today and  participate in his care. Please feel free to contact me with any questions or concerns.  Sincerely,  Rexene Alberts, DO Allergy & Immunology  [facility] office: [place] office: [phone]

## 2022-10-01 ENCOUNTER — Ambulatory Visit: Payer: BC Managed Care – PPO | Admitting: Allergy

## 2022-10-01 DIAGNOSIS — J302 Other seasonal allergic rhinitis: Secondary | ICD-10-CM

## 2022-10-01 DIAGNOSIS — K219 Gastro-esophageal reflux disease without esophagitis: Secondary | ICD-10-CM

## 2022-10-14 NOTE — Progress Notes (Signed)
[name]  RE: Cody Waters MRN: [id number] DOB: 2000/10/15 Date of Office Visit: 10/15/2022  Referring provider: Ma Hillock, DO Primary care provider: Ma Hillock, DO  Chief Complaint: Other (Needs ) and Sinus Problem (Prior auth for feserna)  History of Present Illness: I had the pleasure of seeing Cody Waters for a follow up visit at the [facility] [place] on 10/15/2022. He is a 22 y.o. male, who is being followed for asthma, allergic rhinitis and GERD. His previous allergy office visit was on 03/12/2022 with Dr. Maudie Mercury. Today is a regular follow up visit.  Asthma Currently on Fasenra injections every 8 weeks with no issues.  Taking Breztri 2 puffs BID and using albuterol as needed about 1-2 times per week. Usually gets nauseous at night which then flares his asthma.    Currently taking famotidine 20mg  as needed with some benefit.   Last injection was in December 2023.    Seasonal and perennial allergic rhinitis Using Ryaltris 1 spray per nostril once a day. No nosebleeds. Symptoms flared while off Singulair the past week. Needs refills.   Assessment and Plan: Cody Waters is a 22 y.o. male with: Severe persistent asthma without complication Past history - Diagnosed with asthma about 10 years ago.  Worsening symptoms the last 2 to 3 months requiring daily albuterol use.  Episodes of lightheadedness, nausea, hallucination, vision change.  ER visit twice in November due to this and using albuterol over 30 times causing palpitations. 2021 spirometry showed moderate obstruction with 28% improvement in FEV1 post bronchodilator treatment.  Clinically feeling much improved. March 2023 - eos 500. Interim history - tolerating Berna Bue and doing well with Fasenra at home injections. Asthma flares when reflux flares.  Today's spirometry was normal. Continue Fasenra injections every 8 weeks.  Daily controller medication(s): Take Breztri 2 puffs twice a day with spacer and  rinse mouth afterwards. Restart Singulair (montelukast) 10mg  daily at night. May use albuterol rescue inhaler 2 puffs or nebulizer every 4 to 6 hours as needed for shortness of breath, chest tightness, coughing, and wheezing. May use albuterol rescue inhaler 2 puffs 5 to 15 minutes prior to strenuous physical activities. Monitor frequency of use.  Get spirometry at next visit.  Seasonal and perennial allergic rhinitis Past history - Perennial rhinitis symptoms and takes Zyrtec with some benefit.  Unsure if Singulair was effective.  2015 blood work was positive to dust mites, cat, dog and tree. 1 cat at home. 2021 Bloodwork positive to dust mites, cat, dog, tree pollen, weed pollen. 2023 skin testing was positive to weed, trees, mold, cat, dog and dust mites. Interim history - flaring since ran out of Singulair. Continue environmental control measures as below. Ryaltris (olopatadine + mometasone nasal spray combination) 1-2 sprays per nostril twice a day as needed.  Use over the counter antihistamines such as Zyrtec (cetirizine), Allegra (fexofenadine), or Xyzal (levocetirizine) daily as needed. May take twice a day during allergy flares. May switch antihistamines every few months.  Gastroesophageal reflux disease Seems to flare at night. Using famotidine only prn. See handout for lifestyle and dietary modifications. Take omeprazole 20mg  once a day in the morning. Nothing to eat or drink for 30 minutes afterwards. Do this for 1 month and see if you feel better.   Return in about 4 months (around 02/13/2023).  Meds ordered this encounter  Medications   montelukast (SINGULAIR) 10 MG tablet    Sig: Take 1 tablet (10 mg total) by mouth  at bedtime.    Dispense:  90 tablet    Refill:  2   Budeson-Glycopyrrol-Formoterol (BREZTRI AEROSPHERE) 160-9-4.8 MCG/ACT AERO    Sig: Inhale 2 puffs into the lungs in the morning and at bedtime. with spacer and rinse mouth afterwards.    Dispense:  32.1 g     Refill:  2   omeprazole (PRILOSEC) 20 MG capsule    Sig: Take 1 capsule (20 mg total) by mouth daily.    Dispense:  30 capsule    Refill:  3   Lab Orders  No laboratory test(s) ordered today    Diagnostics: Spirometry:  Tracings reviewed. His effort: Good reproducible efforts. FVC: 5.75L FEV1: 4.81L, 103% predicted FEV1/FVC ratio: 84% Interpretation: Spirometry consistent with normal pattern.  Please see scanned spirometry results for details.  Medication List:  Current Outpatient Medications  Medication Sig Dispense Refill   albuterol (PROVENTIL) (2.5 MG/3ML) 0.083% nebulizer solution Take 3 mLs (2.5 mg total) by nebulization every 4 (four) hours as needed for wheezing or shortness of breath. 75 mL 1   albuterol (VENTOLIN HFA) 108 (90 Base) MCG/ACT inhaler Inhale 2 puffs into the lungs every 4 (four) hours as needed for wheezing or shortness of breath. 18 g 2   Benralizumab (FASENRA PEN) 30 MG/ML SOAJ Inject 1 mL (30 mg total) into the skin every 28 (twenty-eight) days. FOR 2 LOADING DOSES FIRST DOSE GIVEN 03/12/22, THEN EVERY 8 WEEKS 1 mL 6   Budeson-Glycopyrrol-Formoterol (BREZTRI AEROSPHERE) 160-9-4.8 MCG/ACT AERO Inhale 2 puffs into the lungs in the morning and at bedtime. with spacer and rinse mouth afterwards. 32.1 g 2   buPROPion ER (WELLBUTRIN SR) 100 MG 12 hr tablet TAKE 1 TABLET BY MOUTH EVERY DAY 90 tablet 0   famotidine (PEPCID) 20 MG tablet Take 1 tablet (20 mg total) by mouth daily as needed for heartburn or indigestion. 30 tablet 1   FLUoxetine (PROZAC) 10 MG capsule Take 1 capsule (10 mg total) by mouth daily. 30 capsule 1   Lisdexamfetamine Dimesylate 10 MG CHEW Chew 10 mg by mouth daily. 30 tablet 0   montelukast (SINGULAIR) 10 MG tablet Take 1 tablet (10 mg total) by mouth at bedtime. 90 tablet 2   omeprazole (PRILOSEC) 20 MG capsule Take 1 capsule (20 mg total) by mouth daily. 30 capsule 3   prazosin (MINIPRESS) 2 MG capsule Take 2 mg by mouth at bedtime.      Olopatadine-Mometasone (RYALTRIS) [id number] MCG/ACT SUSP Place 1-2 sprays into the nose in the morning and at bedtime. 29 g 5   No current facility-administered medications for this visit.   Allergies: Allergies  Allergen Reactions   Lactose Intolerance (Gi)    Other     cat, dogs, ragweed and pollen   I reviewed his past medical history, social history, family history, and environmental history and no significant changes have been reported from his previous visit.  Review of Systems  Constitutional:  Negative for appetite change, chills, fever and unexpected weight change.  HENT:  Negative for congestion and rhinorrhea.   Eyes:  Negative for itching.  Respiratory:  Negative for cough, chest tightness, shortness of breath and wheezing.   Gastrointestinal:  Positive for nausea. Negative for abdominal pain.  Skin:  Negative for rash.  Allergic/Immunologic: Positive for environmental allergies.  Neurological:  Negative for headaches.    Objective: BP 122/70   Pulse 100   Temp 98 F (36.7 C)   Resp 20   Ht 5\' 10"  (1.778 m)  Wt 165 lb (74.8 kg)   SpO2 98%   BMI 23.68 kg/m  Body mass index is 23.68 kg/m. Physical Exam Vitals and nursing note reviewed.  Constitutional:      Appearance: Normal appearance. He is well-developed.  HENT:     Head: Normocephalic and atraumatic.     Right Ear: Tympanic membrane and external ear normal.     Left Ear: Tympanic membrane and external ear normal.     Nose: Nose normal.     Mouth/Throat:     Mouth: Mucous membranes are moist.     Pharynx: Oropharynx is clear.  Eyes:     Conjunctiva/sclera: Conjunctivae normal.  Cardiovascular:     Rate and Rhythm: Normal rate and regular rhythm.     Heart sounds: Normal heart sounds. No murmur heard. Pulmonary:     Effort: Pulmonary effort is normal.     Breath sounds: Normal breath sounds. No wheezing, rhonchi or rales.  Musculoskeletal:     Cervical back: Neck supple.  Skin:    General: Skin  is warm.     Findings: No rash.  Neurological:     Mental Status: He is alert and oriented to person, place, and time.  Psychiatric:        Behavior: Behavior normal.    Previous notes and tests were reviewed. The plan was reviewed with the patient/family, and all questions/concerned were addressed.  It was my pleasure to see Cody Waters today and participate in his care. Please feel free to contact me with any questions or concerns.  Sincerely,  Wyline Mood, DO Allergy & Immunology  [facility] office: [phone] [facility] office: [phone]

## 2022-10-15 ENCOUNTER — Encounter: Payer: Self-pay | Admitting: Allergy

## 2022-10-15 ENCOUNTER — Other Ambulatory Visit: Payer: Self-pay

## 2022-10-15 ENCOUNTER — Ambulatory Visit: Payer: BC Managed Care – PPO | Admitting: Allergy

## 2022-10-15 VITALS — BP 122/70 | HR 100 | Temp 98.0°F | Resp 20 | Ht 70.0 in | Wt 165.0 lb

## 2022-10-15 DIAGNOSIS — K219 Gastro-esophageal reflux disease without esophagitis: Secondary | ICD-10-CM | POA: Diagnosis not present

## 2022-10-15 DIAGNOSIS — J3089 Other allergic rhinitis: Secondary | ICD-10-CM

## 2022-10-15 DIAGNOSIS — J302 Other seasonal allergic rhinitis: Secondary | ICD-10-CM

## 2022-10-15 DIAGNOSIS — J4550 Severe persistent asthma, uncomplicated: Secondary | ICD-10-CM

## 2022-10-15 MED ORDER — MONTELUKAST SODIUM 10 MG PO TABS: 10.0000 mg | ORAL_TABLET | Freq: Every day | ORAL | 2 refills | Status: DC

## 2022-10-15 MED ORDER — BREZTRI AEROSPHERE 160-9-4.8 MCG/ACT IN AERO: 2.0000 | INHALATION_SPRAY | Freq: Two times a day (BID) | RESPIRATORY_TRACT | 2 refills | Status: DC

## 2022-10-15 MED ORDER — OMEPRAZOLE 20 MG PO CPDR: 20.0000 mg | DELAYED_RELEASE_CAPSULE | Freq: Every day | ORAL | 3 refills | Status: DC

## 2022-10-15 NOTE — Assessment & Plan Note (Signed)
Seems to flare at night. Using famotidine only prn. See handout for lifestyle and dietary modifications. Take omeprazole 20mg  once a day in the morning. Nothing to eat or drink for 30 minutes afterwards. Do this for 1 month and see if you feel better.

## 2022-10-15 NOTE — Assessment & Plan Note (Signed)
Past history - Diagnosed with asthma about 10 years ago.  Worsening symptoms the last 2 to 3 months requiring daily albuterol use.  Episodes of lightheadedness, nausea, hallucination, vision change.  ER visit twice in November due to this and using albuterol over 30 times causing palpitations. 2021 spirometry showed moderate obstruction with 28% improvement in FEV1 post bronchodilator treatment.  Clinically feeling much improved. March 2023 - eos 500. Interim history - tolerating Berna Bue and doing well with Fasenra at home injections. Asthma flares when reflux flares.  Today's spirometry was normal. Continue Fasenra injections every 8 weeks.  Daily controller medication(s): Take Breztri 2 puffs twice a day with spacer and rinse mouth afterwards. Restart Singulair (montelukast) 10mg  daily at night. May use albuterol rescue inhaler 2 puffs or nebulizer every 4 to 6 hours as needed for shortness of breath, chest tightness, coughing, and wheezing. May use albuterol rescue inhaler 2 puffs 5 to 15 minutes prior to strenuous physical activities. Monitor frequency of use.  Get spirometry at next visit.

## 2022-10-15 NOTE — Patient Instructions (Addendum)
Asthma Your breathing test was normal today.  Continue Fasenra injections every 8 weeks.  Daily controller medication(s): Take Breztri 2 puffs twice a day with spacer and rinse mouth afterwards. Restart Singulair (montelukast) 10mg  daily at night. May use albuterol rescue inhaler 2 puffs or nebulizer every 4 to 6 hours as needed for shortness of breath, chest tightness, coughing, and wheezing. May use albuterol rescue inhaler 2 puffs 5 to 15 minutes prior to strenuous physical activities. Monitor frequency of use.  Asthma control goals:  Full participation in all desired activities (may need albuterol before activity) Albuterol use two times or less a week on average (not counting use with activity) Cough interfering with sleep two times or less a month Oral steroids no more than once a year No hospitalizations   Environmental allergies Continue environmental control measures as below. Bloodwork positive to dust mites, cat, dog, tree pollen, weed pollen. Ryaltris (olopatadine + mometasone nasal spray combination) 1-2 sprays per nostril twice a day as needed.  Use over the counter antihistamines such as Zyrtec (cetirizine), Allegra (fexofenadine), or Xyzal (levocetirizine) daily as needed. May take twice a day during allergy flares. May switch antihistamines every few months.  Heartburn: See handout for lifestyle and dietary modifications. Take omeprazole 20mg  once a day in the morning. Nothing to eat or drink for 30 minutes afterwards. Do this for 1 month and see if you feel better.   Follow up in 4 months or sooner if needed.   Reducing Pollen Exposure Pollen seasons: trees (spring), grass (summer) and ragweed/weeds (fall). Keep windows closed in your home and car to lower pollen exposure.  Install air conditioning in the bedroom and throughout the house if possible.  Avoid going out in dry windy days - especially early morning. Pollen counts are highest between 5 - 10 AM and on dry,  hot and windy days.  Save outside activities for late afternoon or after a heavy rain, when pollen levels are lower.  Avoid mowing of grass if you have grass pollen allergy. Be aware that pollen can also be transported indoors on people and pets.  Dry your clothes in an automatic dryer rather than hanging them outside where they might collect pollen.  Rinse hair and eyes before bedtime. Pet Allergen Avoidance: Contrary to popular opinion, there are no "hypoallergenic" breeds of dogs or cats. That is because people are not allergic to an animal's hair, but to an allergen found in the animal's saliva, dander (dead skin flakes) or urine. Pet allergy symptoms typically occur within minutes. For some people, symptoms can build up and become most severe 8 to 12 hours after contact with the animal. People with severe allergies can experience reactions in public places if dander has been transported on the pet owners' clothing. Keeping an animal outdoors is only a partial solution, since homes with pets in the yard still have higher concentrations of animal allergens. Before getting a pet, ask your allergist to determine if you are allergic to animals. If your pet is already considered part of your family, try to minimize contact and keep the pet out of the bedroom and other rooms where you spend a great deal of time. As with dust mites, vacuum carpets often or replace carpet with a hardwood floor, tile or linoleum. High-efficiency particulate air (HEPA) cleaners can reduce allergen levels over time. While dander and saliva are the source of cat and dog allergens, urine is the source of allergens from rabbits, hamsters, mice and [place] pigs; so  ask a non-allergic family member to clean the animal's cage. If you have a pet allergy, talk to your allergist about the potential for allergy immunotherapy (allergy shots). This strategy can often provide long-term relief. Control of House Dust Mite Allergen Dust mite  allergens are a common trigger of allergy and asthma symptoms. While they can be found throughout the house, these microscopic creatures thrive in warm, humid environments such as bedding, upholstered furniture and carpeting. Because so much time is spent in the bedroom, it is essential to reduce mite levels there.  Encase pillows, mattresses, and box springs in special allergen-proof fabric covers or airtight, zippered plastic covers.  Bedding should be washed weekly in hot water (130 F) and dried in a hot dryer. Allergen-proof covers are available for comforters and pillows that can't be regularly washed.  Wash the allergy-proof covers every few months. Minimize clutter in the bedroom. Keep pets out of the bedroom.  Keep humidity less than 50% by using a dehumidifier or air conditioning. You can buy a humidity measuring device called a hygrometer to monitor this.  If possible, replace carpets with hardwood, linoleum, or washable area rugs. If that's not possible, vacuum frequently with a vacuum that has a HEPA filter. Remove all upholstered furniture and non-washable window drapes from the bedroom. Remove all non-washable stuffed toys from the bedroom.  Wash stuffed toys weekly.

## 2022-10-15 NOTE — Assessment & Plan Note (Signed)
Past history - Perennial rhinitis symptoms and takes Zyrtec with some benefit.  Unsure if Singulair was effective.  2015 blood work was positive to dust mites, cat, dog and tree. 1 cat at home. 2021 Bloodwork positive to dust mites, cat, dog, tree pollen, weed pollen. 2023 skin testing was positive to weed, trees, mold, cat, dog and dust mites. Interim history - flaring since ran out of Singulair. Continue environmental control measures as below. Ryaltris (olopatadine + mometasone nasal spray combination) 1-2 sprays per nostril twice a day as needed.  Use over the counter antihistamines such as Zyrtec (cetirizine), Allegra (fexofenadine), or Xyzal (levocetirizine) daily as needed. May take twice a day during allergy flares. May switch antihistamines every few months.

## 2022-11-06 ENCOUNTER — Telehealth (HOSPITAL_COMMUNITY): Payer: Self-pay | Admitting: *Deleted

## 2022-11-06 NOTE — Telephone Encounter (Signed)
Dismissed Notice pop-up  last appt 06-25-22 no showed 3 following that

## 2022-11-08 ENCOUNTER — Telehealth (HOSPITAL_COMMUNITY): Payer: Self-pay

## 2022-11-08 DIAGNOSIS — F331 Major depressive disorder, recurrent, moderate: Secondary | ICD-10-CM

## 2022-11-08 MED ORDER — BUPROPION HCL ER (SR) 100 MG PO TB12: 100.0000 mg | ORAL_TABLET | Freq: Every day | ORAL | 0 refills | Status: AC

## 2022-11-08 NOTE — Telephone Encounter (Signed)
Medication refill - Fax from patient's [facility] on [address] requesting a new Bupropion 100 mg order, last filled for 90 days on 08/03/22 and then patient no showed for several appointments in December 2023 with no current appt scheduled.

## 2022-11-08 NOTE — Telephone Encounter (Signed)
A new one time 30 day order for patient's prescribed Bupropion ER (Wellbutrin SR) 100 mg, one daily, #30 with no refills e-scribed to [facility]'s [facility] on [address] per verbal authorization by Dr. De Nurse and note that no further refills would be ordered until patient calls to schedule a new appointment.

## 2022-12-06 ENCOUNTER — Telehealth: Payer: Self-pay | Admitting: Allergy

## 2022-12-06 MED ORDER — BREZTRI AEROSPHERE 160-9-4.8 MCG/ACT IN AERO: 2.0000 | INHALATION_SPRAY | Freq: Two times a day (BID) | RESPIRATORY_TRACT | 2 refills | Status: DC

## 2022-12-06 MED ORDER — ALBUTEROL SULFATE HFA 108 (90 BASE) MCG/ACT IN AERS: 2.0000 | INHALATION_SPRAY | RESPIRATORY_TRACT | 1 refills | Status: DC | PRN

## 2022-12-06 NOTE — Telephone Encounter (Signed)
Refill has been sent to pharmacy on file.  

## 2022-12-06 NOTE — Telephone Encounter (Signed)
Patient is requesting refills for [facility] and albuterol.

## 2022-12-09 ENCOUNTER — Telehealth: Payer: Self-pay | Admitting: Allergy

## 2022-12-09 ENCOUNTER — Other Ambulatory Visit: Payer: Self-pay | Admitting: Allergy

## 2022-12-09 NOTE — Telephone Encounter (Signed)
LVM letting patient know that the prescription was sent into the pharmacy on 12/06/22. Called the pharmacy to find out if there was any hold up for the prescription. Pharmacy states that the prescription is ready for patient to pick up and that the patient should receive a text letting him know that it is ready for pick up.

## 2022-12-09 NOTE — Telephone Encounter (Signed)
Patient called and said the he needs a refill on the [facility] inhaler. He is using it 2x a day. [facility] [address] [phone]

## 2022-12-23 ENCOUNTER — Encounter: Payer: BC Managed Care – PPO | Admitting: Family Medicine

## 2023-01-19 ENCOUNTER — Encounter: Payer: Self-pay | Admitting: Family Medicine

## 2023-02-10 NOTE — Progress Notes (Deleted)
[name]  RE: Cody Waters MRN: [id number] DOB: Mar 26, 2001 Date of Office Visit: 02/11/2023  Referring provider: Natalia Leatherwood, DO Primary care provider: Natalia Leatherwood, DO  Chief Complaint: No chief complaint on file.  History of Present Illness: I had the pleasure of seeing Cody Waters for a follow up visit at the [facility] on 02/10/2023. He is a 22 y.o. male, who is being followed for asthma on Fasenra, allergic rhinitis and GERD. His previous allergy office visit was on 10/15/2022 with Dr. Selena Batten. Today is a regular follow up visit.  Severe persistent asthma without complication Past history - Diagnosed with asthma about 10 years ago.  Worsening symptoms the last 2 to 3 months requiring daily albuterol use.  Episodes of lightheadedness, nausea, hallucination, vision change.  ER visit twice in November due to this and using albuterol over 30 times causing palpitations. 2021 spirometry showed moderate obstruction with 28% improvement in FEV1 post bronchodilator treatment.  Clinically feeling much improved. March 2023 - eos 500. Interim history - tolerating Harrington Challenger and doing well with Fasenra at home injections. Asthma flares when reflux flares.  Today's spirometry was normal. Continue Fasenra injections every 8 weeks.  Daily controller medication(s): Take Breztri 2 puffs twice a day with spacer and rinse mouth afterwards. Restart Singulair (montelukast) 10mg  daily at night. May use albuterol rescue inhaler 2 puffs or nebulizer every 4 to 6 hours as needed for shortness of breath, chest tightness, coughing, and wheezing. May use albuterol rescue inhaler 2 puffs 5 to 15 minutes prior to strenuous physical activities. Monitor frequency of use.  Get spirometry at next visit.   Seasonal and perennial allergic rhinitis Past history - Perennial rhinitis symptoms and takes Zyrtec with some benefit.  Unsure if Singulair was effective.  2015 blood work was positive to  dust mites, cat, dog and tree. 1 cat at home. 2021 Bloodwork positive to dust mites, cat, dog, tree pollen, weed pollen. 2023 skin testing was positive to weed, trees, mold, cat, dog and dust mites. Interim history - flaring since ran out of Singulair. Continue environmental control measures as below. Ryaltris (olopatadine + mometasone nasal spray combination) 1-2 sprays per nostril twice a day as needed.  Use over the counter antihistamines such as Zyrtec (cetirizine), Allegra (fexofenadine), or Xyzal (levocetirizine) daily as needed. May take twice a day during allergy flares. May switch antihistamines every few months.   Gastroesophageal reflux disease Seems to flare at night. Using famotidine only prn. See handout for lifestyle and dietary modifications. Take omeprazole 20mg  once a day in the morning. Nothing to eat or drink for 30 minutes afterwards. Do this for 1 month and see if you feel better.    Return in about 4 months (around 02/13/2023).  Assessment and Plan: Cody Waters is a 22 y.o. male with: No problem-specific Assessment & Plan notes found for this encounter.  No follow-ups on file.  No orders of the defined types were placed in this encounter.  Lab Orders  No laboratory test(s) ordered today    Diagnostics: Spirometry:  Tracings reviewed. His effort: {Blank single:19197::"Good reproducible efforts.","It was hard to get consistent efforts and there is a question as to whether this reflects a maximal maneuver.","Poor effort, data can not be interpreted."} FVC: ***L FEV1: ***L, ***% predicted FEV1/FVC ratio: ***% Interpretation: {Blank single:19197::"Spirometry consistent with mild obstructive disease","Spirometry consistent with moderate obstructive disease","Spirometry consistent with severe obstructive disease","Spirometry consistent with possible restrictive disease","Spirometry consistent with mixed obstructive and restrictive  disease","Spirometry uninterpretable due to  technique","Spirometry consistent with normal pattern","No overt abnormalities noted given today's efforts"}.  Please see scanned spirometry results for details.  Skin Testing: {Blank single:19197::"Select foods","Environmental allergy panel","Environmental allergy panel and select foods","Food allergy panel","None","Deferred due to recent antihistamines use"}. *** Results discussed with patient/family.   Medication List:  Current Outpatient Medications  Medication Sig Dispense Refill  . albuterol (PROVENTIL) (2.5 MG/3ML) 0.083% nebulizer solution Take 3 mLs (2.5 mg total) by nebulization every 4 (four) hours as needed for wheezing or shortness of breath. 75 mL 1  . albuterol (VENTOLIN HFA) 108 (90 Base) MCG/ACT inhaler Inhale 2 puffs into the lungs every 4 (four) hours as needed for wheezing or shortness of breath. 18 g 1  . Benralizumab (FASENRA PEN) 30 MG/ML SOAJ Inject 1 mL (30 mg total) into the skin every 28 (twenty-eight) days. FOR 2 LOADING DOSES FIRST DOSE GIVEN 03/12/22, THEN EVERY 8 WEEKS 1 mL 6  . Budeson-Glycopyrrol-Formoterol (BREZTRI AEROSPHERE) 160-9-4.8 MCG/ACT AERO Inhale 2 puffs into the lungs in the morning and at bedtime. with spacer and rinse mouth afterwards. 21.4 g 2  . buPROPion ER (WELLBUTRIN SR) 100 MG 12 hr tablet Take 1 tablet (100 mg total) by mouth daily. 30 tablet 0  . famotidine (PEPCID) 20 MG tablet Take 1 tablet (20 mg total) by mouth daily as needed for heartburn or indigestion. 30 tablet 1  . FLUoxetine (PROZAC) 10 MG capsule Take 1 capsule (10 mg total) by mouth daily. 30 capsule 1  . Lisdexamfetamine Dimesylate 10 MG CHEW Chew 10 mg by mouth daily. 30 tablet 0  . montelukast (SINGULAIR) 10 MG tablet Take 1 tablet (10 mg total) by mouth at bedtime. 90 tablet 2  . Olopatadine-Mometasone (RYALTRIS) X543819 MCG/ACT SUSP Place 1-2 sprays into the nose in the morning and at bedtime. 29 g 5  . omeprazole (PRILOSEC) 20 MG capsule TAKE 1 CAPSULE BY MOUTH EVERY DAY 90  capsule 1  . prazosin (MINIPRESS) 2 MG capsule Take 2 mg by mouth at bedtime.     No current facility-administered medications for this visit.   Allergies: Allergies  Allergen Reactions  . Lactose Intolerance (Gi)   . Other     cat, dogs, ragweed and pollen   I reviewed his past medical history, social history, family history, and environmental history and no significant changes have been reported from his previous visit.  Review of Systems  Constitutional:  Negative for appetite change, chills, fever and unexpected weight change.  HENT:  Negative for congestion and rhinorrhea.   Eyes:  Negative for itching.  Respiratory:  Negative for cough, chest tightness, shortness of breath and wheezing.   Gastrointestinal:  Positive for nausea. Negative for abdominal pain.  Skin:  Negative for rash.  Allergic/Immunologic: Positive for environmental allergies.  Neurological:  Negative for headaches.   Objective: There were no vitals taken for this visit. There is no height or weight on file to calculate BMI. Physical Exam Vitals and nursing note reviewed.  Constitutional:      Appearance: Normal appearance. He is well-developed.  HENT:     Head: Normocephalic and atraumatic.     Right Ear: Tympanic membrane and external ear normal.     Left Ear: Tympanic membrane and external ear normal.     Nose: Nose normal.     Mouth/Throat:     Mouth: Mucous membranes are moist.     Pharynx: Oropharynx is clear.  Eyes:     Conjunctiva/sclera: Conjunctivae normal.  Cardiovascular:  Rate and Rhythm: Normal rate and regular rhythm.     Heart sounds: Normal heart sounds. No murmur heard. Pulmonary:     Effort: Pulmonary effort is normal.     Breath sounds: Normal breath sounds. No wheezing, rhonchi or rales.  Musculoskeletal:     Cervical back: Neck supple.  Skin:    General: Skin is warm.     Findings: No rash.  Neurological:     Mental Status: He is alert and oriented to person, place,  and time.  Psychiatric:        Behavior: Behavior normal.  Previous notes and tests were reviewed. The plan was reviewed with the patient/family, and all questions/concerned were addressed.  It was my pleasure to see Cody Waters today and participate in his care. Please feel free to contact me with any questions or concerns.  Sincerely,  Wyline Mood, DO Allergy & Immunology  [facility] office: [phone] [facility] office: [phone]

## 2023-02-11 ENCOUNTER — Ambulatory Visit: Payer: BC Managed Care – PPO | Admitting: Allergy

## 2023-03-06 ENCOUNTER — Other Ambulatory Visit: Payer: Self-pay | Admitting: Allergy

## 2023-04-03 ENCOUNTER — Ambulatory Visit: Payer: BC Managed Care – PPO | Admitting: Allergy

## 2023-04-03 DIAGNOSIS — J309 Allergic rhinitis, unspecified: Secondary | ICD-10-CM

## 2023-04-03 NOTE — Progress Notes (Deleted)
[name]  RE: Cody Waters MRN: [id number] DOB: 14-Dec-2000 Date of Office Visit: 04/03/2023  Referring provider: Natalia Leatherwood, DO Primary care provider: Natalia Leatherwood, DO  Chief Complaint: No chief complaint on file.  History of Present Illness: I had the pleasure of seeing Cody Waters for a follow up visit at the [facility] on 04/03/2023. He is a 22 y.o. male, who is being followed for asthma on Fasenra, allergic rhinitis and GERD. His previous allergy office visit was on 10/15/2022 with Dr. Selena Batten. Today is a regular follow up visit.  Severe persistent asthma without complication Past history - Diagnosed with asthma about 10 years ago.  Worsening symptoms the last 2 to 3 months requiring daily albuterol use.  Episodes of lightheadedness, nausea, hallucination, vision change.  ER visit twice in November due to this and using albuterol over 30 times causing palpitations. 2021 spirometry showed moderate obstruction with 28% improvement in FEV1 post bronchodilator treatment.  Clinically feeling much improved. March 2023 - eos 500. Interim history - tolerating Harrington Challenger and doing well with Fasenra at home injections. Asthma flares when reflux flares.  Today's spirometry was normal. Continue Fasenra injections every 8 weeks.  Daily controller medication(s): Take Breztri 2 puffs twice a day with spacer and rinse mouth afterwards. Restart Singulair (montelukast) 10mg  daily at night. May use albuterol rescue inhaler 2 puffs or nebulizer every 4 to 6 hours as needed for shortness of breath, chest tightness, coughing, and wheezing. May use albuterol rescue inhaler 2 puffs 5 to 15 minutes prior to strenuous physical activities. Monitor frequency of use.  Get spirometry at next visit.   Seasonal and perennial allergic rhinitis Past history - Perennial rhinitis symptoms and takes Zyrtec with some benefit.  Unsure if Singulair was effective.  2015 blood work was positive to  dust mites, cat, dog and tree. 1 cat at home. 2021 Bloodwork positive to dust mites, cat, dog, tree pollen, weed pollen. 2023 skin testing was positive to weed, trees, mold, cat, dog and dust mites. Interim history - flaring since ran out of Singulair. Continue environmental control measures as below. Ryaltris (olopatadine + mometasone nasal spray combination) 1-2 sprays per nostril twice a day as needed.  Use over the counter antihistamines such as Zyrtec (cetirizine), Allegra (fexofenadine), or Xyzal (levocetirizine) daily as needed. May take twice a day during allergy flares. May switch antihistamines every few months.   Gastroesophageal reflux disease Seems to flare at night. Using famotidine only prn. See handout for lifestyle and dietary modifications. Take omeprazole 20mg  once a day in the morning. Nothing to eat or drink for 30 minutes afterwards. Do this for 1 month and see if you feel better.    Return in about 4 months (around 02/13/2023).  Assessment and Plan: Cody Waters is a 22 y.o. male with: No problem-specific Assessment & Plan notes found for this encounter.  No follow-ups on file.  No orders of the defined types were placed in this encounter.  Lab Orders  No laboratory test(s) ordered today    Diagnostics: Spirometry:  Tracings reviewed. His effort: {Blank single:19197::"Good reproducible efforts.","It was hard to get consistent efforts and there is a question as to whether this reflects a maximal maneuver.","Poor effort, data can not be interpreted."} FVC: ***L FEV1: ***L, ***% predicted FEV1/FVC ratio: ***% Interpretation: {Blank single:19197::"Spirometry consistent with mild obstructive disease","Spirometry consistent with moderate obstructive disease","Spirometry consistent with severe obstructive disease","Spirometry consistent with possible restrictive disease","Spirometry consistent with mixed obstructive and restrictive  disease","Spirometry uninterpretable due to  technique","Spirometry consistent with normal pattern","No overt abnormalities noted given today's efforts"}.  Please see scanned spirometry results for details.  Skin Testing: {Blank single:19197::"Select foods","Environmental allergy panel","Environmental allergy panel and select foods","Food allergy panel","None","Deferred due to recent antihistamines use"}. *** Results discussed with patient/family.   Medication List:  Current Outpatient Medications  Medication Sig Dispense Refill  . albuterol (VENTOLIN HFA) 108 (90 Base) MCG/ACT inhaler INHALE 2 PUFFS INTO THE LUNGS EVERY 4 HOURS AS NEEDED FOR WHEEZING OR SHORTNESS OF BREATH. 18 each 1  . albuterol (PROVENTIL) (2.5 MG/3ML) 0.083% nebulizer solution Take 3 mLs (2.5 mg total) by nebulization every 4 (four) hours as needed for wheezing or shortness of breath. 75 mL 1  . Benralizumab (FASENRA PEN) 30 MG/ML SOAJ Inject 1 mL (30 mg total) into the skin every 28 (twenty-eight) days. FOR 2 LOADING DOSES FIRST DOSE GIVEN 03/12/22, THEN EVERY 8 WEEKS 1 mL 6  . Budeson-Glycopyrrol-Formoterol (BREZTRI AEROSPHERE) 160-9-4.8 MCG/ACT AERO Inhale 2 puffs into the lungs in the morning and at bedtime. with spacer and rinse mouth afterwards. 21.4 g 2  . buPROPion ER (WELLBUTRIN SR) 100 MG 12 hr tablet Take 1 tablet (100 mg total) by mouth daily. 30 tablet 0  . famotidine (PEPCID) 20 MG tablet Take 1 tablet (20 mg total) by mouth daily as needed for heartburn or indigestion. 30 tablet 1  . FLUoxetine (PROZAC) 10 MG capsule Take 1 capsule (10 mg total) by mouth daily. 30 capsule 1  . Lisdexamfetamine Dimesylate 10 MG CHEW Chew 10 mg by mouth daily. 30 tablet 0  . montelukast (SINGULAIR) 10 MG tablet Take 1 tablet (10 mg total) by mouth at bedtime. 90 tablet 2  . Olopatadine-Mometasone (RYALTRIS) [id number] MCG/ACT SUSP Place 1-2 sprays into the nose in the morning and at bedtime. 29 g 5  . omeprazole (PRILOSEC) 20 MG capsule TAKE 1 CAPSULE BY MOUTH EVERY DAY 90  capsule 1  . prazosin (MINIPRESS) 2 MG capsule Take 2 mg by mouth at bedtime.     No current facility-administered medications for this visit.   Allergies: Allergies  Allergen Reactions  . Lactose Intolerance (Gi)   . Other     cat, dogs, ragweed and pollen   I reviewed his past medical history, social history, family history, and environmental history and no significant changes have been reported from his previous visit.  Review of Systems  Constitutional:  Negative for appetite change, chills, fever and unexpected weight change.  HENT:  Negative for congestion and rhinorrhea.   Eyes:  Negative for itching.  Respiratory:  Negative for cough, chest tightness, shortness of breath and wheezing.   Gastrointestinal:  Positive for nausea. Negative for abdominal pain.  Skin:  Negative for rash.  Allergic/Immunologic: Positive for environmental allergies.  Neurological:  Negative for headaches.   Objective: There were no vitals taken for this visit. There is no height or weight on file to calculate BMI. Physical Exam Vitals and nursing note reviewed.  Constitutional:      Appearance: Normal appearance. He is well-developed.  HENT:     Head: Normocephalic and atraumatic.     Right Ear: Tympanic membrane and external ear normal.     Left Ear: Tympanic membrane and external ear normal.     Nose: Nose normal.     Mouth/Throat:     Mouth: Mucous membranes are moist.     Pharynx: Oropharynx is clear.  Eyes:     Conjunctiva/sclera: Conjunctivae normal.  Cardiovascular:  Rate and Rhythm: Normal rate and regular rhythm.     Heart sounds: Normal heart sounds. No murmur heard. Pulmonary:     Effort: Pulmonary effort is normal.     Breath sounds: Normal breath sounds. No wheezing, rhonchi or rales.  Musculoskeletal:     Cervical back: Neck supple.  Skin:    General: Skin is warm.     Findings: No rash.  Neurological:     Mental Status: He is alert and oriented to person, place,  and time.  Psychiatric:        Behavior: Behavior normal.  Previous notes and tests were reviewed. The plan was reviewed with the patient/family, and all questions/concerned were addressed.  It was my pleasure to see Haldon today and participate in his care. Please feel free to contact me with any questions or concerns.  Sincerely,  Wyline Mood, DO Allergy & Immunology  [facility] office: [phone] [facility] office: [phone]

## 2023-04-17 ENCOUNTER — Other Ambulatory Visit: Payer: Self-pay | Admitting: *Deleted

## 2023-04-17 MED ORDER — FASENRA PEN 30 MG/ML ~~LOC~~ SOAJ: 30.0000 mg | SUBCUTANEOUS | 6 refills | Status: DC

## 2023-09-25 ENCOUNTER — Encounter: Payer: Self-pay | Admitting: Family Medicine

## 2023-09-30 ENCOUNTER — Other Ambulatory Visit: Payer: Self-pay | Admitting: Allergy

## 2023-10-02 ENCOUNTER — Ambulatory Visit: Payer: BC Managed Care – PPO | Admitting: Allergy

## 2023-10-03 ENCOUNTER — Ambulatory Visit: Payer: Self-pay | Admitting: Family Medicine

## 2023-10-03 ENCOUNTER — Ambulatory Visit: Payer: Self-pay | Admitting: Internal Medicine

## 2023-10-07 ENCOUNTER — Ambulatory Visit (INDEPENDENT_AMBULATORY_CARE_PROVIDER_SITE_OTHER): Payer: Self-pay | Admitting: Internal Medicine

## 2023-10-07 ENCOUNTER — Encounter: Payer: Self-pay | Admitting: Internal Medicine

## 2023-10-07 ENCOUNTER — Other Ambulatory Visit: Payer: Self-pay

## 2023-10-07 VITALS — BP 112/78 | HR 70 | Temp 98.2°F | Ht 70.0 in | Wt 171.4 lb

## 2023-10-07 DIAGNOSIS — J4550 Severe persistent asthma, uncomplicated: Secondary | ICD-10-CM | POA: Diagnosis not present

## 2023-10-07 DIAGNOSIS — J3089 Other allergic rhinitis: Secondary | ICD-10-CM

## 2023-10-07 DIAGNOSIS — K219 Gastro-esophageal reflux disease without esophagitis: Secondary | ICD-10-CM

## 2023-10-07 DIAGNOSIS — J302 Other seasonal allergic rhinitis: Secondary | ICD-10-CM | POA: Diagnosis not present

## 2023-10-07 MED ORDER — RYALTRIS 665-25 MCG/ACT NA SUSP: 1.0000 | Freq: Two times a day (BID) | NASAL | 5 refills | Status: AC

## 2023-10-07 MED ORDER — MONTELUKAST SODIUM 10 MG PO TABS: 10.0000 mg | ORAL_TABLET | Freq: Every day | ORAL | 1 refills | Status: DC

## 2023-10-07 MED ORDER — BREZTRI AEROSPHERE 160-9-4.8 MCG/ACT IN AERO: 2.0000 | INHALATION_SPRAY | Freq: Two times a day (BID) | RESPIRATORY_TRACT | 5 refills | Status: DC

## 2023-10-07 MED ORDER — ALBUTEROL SULFATE HFA 108 (90 BASE) MCG/ACT IN AERS: 1.0000 | INHALATION_SPRAY | Freq: Four times a day (QID) | RESPIRATORY_TRACT | 1 refills | Status: DC | PRN
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
## 2023-10-07 NOTE — Patient Instructions (Addendum)
 Severe Persistent Asthma: - Maintenance inhaler:  Continue Breztri  160-9-4.69mcg 2 puffs twice daily.  Continue Fasenra  30mg  SQ every 8 weeks. Continue Singulair  10mg  daily.  - Rescue inhaler: Albuterol  2 puffs via spacer or 1 vial via nebulizer every 4-6 hours as needed for respiratory symptoms of cough, shortness of breath, or wheezing Asthma control goals:  Full participation in all desired activities (may need albuterol  before activity) Albuterol  use two times or less a week on average (not counting use with activity) Cough interfering with sleep two times or less a month Oral steroids no more than once a year No hospitalizations  Allergic Rhinitis:  - sIgE 08/2020 positive to dust mites, cat, dog, tree pollen, weed pollen. - Use Ryaltris  (olopatadine + mometasone nasal spray combination) 1-2 sprays per nostril twice daily as needed.  - Use over the counter antihistamines such as Zyrtec (cetirizine), Allegra (fexofenadine), or Xyzal (levocetirizine) daily as needed. May take twice a day during allergy  flares. May switch antihistamines every few months.  GERD: - Continue Omeprazole  20mg  daily.  Can try weaning/stopping it since your symptoms are doing well.  But if reflux worsens, get back on it daily.  -Avoid lying down for at least two hours after a meal or after drinking acidic beverages, like soda, or other caffeinated beverages. This can help to prevent stomach contents from flowing back into the esophagus. -Keep your head elevated while you sleep. Using an extra pillow or two can also help to prevent reflux. -Eat smaller and more frequent meals each day instead of a few large meals. This promotes digestion and can aid in preventing heartburn. -Wear loose-fitting clothes to ease pressure on the stomach, which can worsen heartburn and reflux. -Reduce excess weight around the midsection. This can ease pressure on the stomach. Such pressure can force some stomach contents back up the  esophagus.

## 2023-12-21 ENCOUNTER — Other Ambulatory Visit: Payer: Self-pay | Admitting: Allergy

## 2024-04-05 ENCOUNTER — Other Ambulatory Visit: Payer: Self-pay | Admitting: Family Medicine

## 2024-04-06 ENCOUNTER — Ambulatory Visit: Payer: Self-pay | Admitting: Family

## 2024-04-15 ENCOUNTER — Ambulatory Visit: Admitting: Family

## 2024-04-18 NOTE — Patient Instructions (Incomplete)
 Severe Persistent Asthma:well controlled Consider stepping down in therapy at next office if doing well - Maintenance inhaler:  Continue Breztri  160-9-4.12mcg 2 puffs twice daily with spacer. Rinse mouth out after Continue Fasenra  30mg  SQ every 8 weeks. Continue Singulair  10mg  daily.  - Rescue inhaler: Albuterol  2 puffs via spacer or 1 vial via nebulizer every 4-6 hours as needed for respiratory symptoms of cough, shortness of breath, or wheezing Asthma control goals:  Full participation in all desired activities (may need albuterol  before activity) Albuterol  use two times or less a week on average (not counting use with activity) Cough interfering with sleep two times or less a month Oral steroids no more than once a year No hospitalizations  Allergic Rhinitis: controlled - sIgE 08/2020 positive to dust mites, cat, dog, tree pollen, weed pollen. - Use Ryaltris  (olopatadine + mometasone nasal spray combination) 1-2 sprays per nostril twice daily as needed.  - Use over the counter antihistamines such as Zyrtec (cetirizine), Allegra (fexofenadine), or Xyzal (levocetirizine) daily as needed. May take twice a day during allergy  flares. May switch antihistamines every few months.  GERD:controlled - Continue Omeprazole  20mg  daily.  Can try weaning/stopping it since your symptoms are doing well.  But if reflux worsens, get back on it daily.  -Avoid lying down for at least two hours after a meal or after drinking acidic beverages, like soda, or other caffeinated beverages. This can help to prevent stomach contents from flowing back into the esophagus. -Keep your head elevated while you sleep. Using an extra pillow or two can also help to prevent reflux. -Eat smaller and more frequent meals each day instead of a few large meals. This promotes digestion and can aid in preventing heartburn. -Wear loose-fitting clothes to ease pressure on the stomach, which can worsen heartburn and reflux. -Reduce excess  weight around the midsection. This can ease pressure on the stomach. Such pressure can force some stomach contents back up the esophagus.   Follow up in 6 months or sooner if needed

## 2024-04-19 ENCOUNTER — Encounter: Payer: Self-pay | Admitting: Family

## 2024-04-19 ENCOUNTER — Other Ambulatory Visit: Payer: Self-pay

## 2024-04-19 ENCOUNTER — Ambulatory Visit: Admitting: Family

## 2024-04-19 VITALS — BP 118/72 | HR 80 | Temp 97.9°F | Resp 20 | Ht 70.0 in | Wt 172.2 lb

## 2024-04-19 DIAGNOSIS — J302 Other seasonal allergic rhinitis: Secondary | ICD-10-CM

## 2024-04-19 DIAGNOSIS — K219 Gastro-esophageal reflux disease without esophagitis: Secondary | ICD-10-CM | POA: Diagnosis not present

## 2024-04-19 DIAGNOSIS — J4550 Severe persistent asthma, uncomplicated: Secondary | ICD-10-CM | POA: Diagnosis not present

## 2024-04-19 DIAGNOSIS — J3089 Other allergic rhinitis: Secondary | ICD-10-CM

## 2024-04-19 MED ORDER — BREZTRI AEROSPHERE 160-9-4.8 MCG/ACT IN AERO: 2.0000 | INHALATION_SPRAY | Freq: Two times a day (BID) | RESPIRATORY_TRACT | 5 refills | Status: AC

## 2024-04-19 MED ORDER — ALBUTEROL SULFATE HFA 108 (90 BASE) MCG/ACT IN AERS: INHALATION_SPRAY | RESPIRATORY_TRACT | 1 refills | Status: AC

## 2024-04-19 NOTE — Progress Notes (Signed)
 [address] [place] [address] Dept: [phone]  [name]  Patient ID: Cody Waters, male    DOB: [birth year]  Age: 23 y.o. MRN: [id number] Date of Office Visit: 04/19/2024  Assessment  Chief Complaint: Follow-up (Follow up doing well, refill albuterol . Finished college. )  HPI Cody Waters is a 23 year old male who presents today for follow-up of seasonal and perennial allergic rhinitis, gastroesophageal reflux disease, and severe persistent asthma without complication.  He was last seen on October 07, 2023 by Dr. Tobie.  He denies any new diagnosis or surgery since his last office visit.  Severe persistent asthma: He continues to take Breztri  2 puffs twice a day with spacer, Fasenra  injections every 8 weeks, Singulair  10 mg daily, and albuterol  as needed.  He denies any problems or reactions with his Fasenra  injections.  He receives his Fasenra  injections at home.  He feels like his asthma has been under good control.  He has had drastic improvement since moving out of the house that had mold in the walls.  He denies cough, wheeze, tightness in chest, shortness of breath, and nocturnal awakenings due to breathing problems.  Since his last office visit he has not required any systemic steroids or made any trips to the emergency room or urgent care due to breathing problems.  He uses his albuterol  once a week if even that.  Allergic rhinitis: He uses Ryaltris  nasal spray as needed and does not need any refills.  He also continues to alternate between antihistamines.  He denies rhinorrhea, nasal congestion, and postnasal drip.  He has not been treated for any sinus infections since we last saw him.  Gastroesophageal reflux disease is reported as fine.SABRA  He does continue to take omeprazole  20 mg daily.  He did not ever try weaning/stopping omeprazole .  He reports that he has been increasing the protein in his diet and is drinking a lot of protein shakes.  He reports that dairy  tends to cause his reflux to flare.   Drug Allergies:  Allergies  Allergen Reactions   Lactose Intolerance (Gi)    Other     cat, dogs, ragweed and pollen    Review of Systems: Negative except as per HPI   Physical Exam: BP 118/72   Pulse 80   Temp 97.9 F (36.6 C) (Temporal)   Resp 20   Ht 5' 10 (1.778 m)   Wt 172 lb 3.2 oz (78.1 kg)   SpO2 98%   BMI 24.71 kg/m    Physical Exam Constitutional:      Appearance: Normal appearance.  HENT:     Head: Normocephalic and atraumatic.     Comments: Pharynx normal, eyes normal, ears normal, nose normal    Right Ear: Tympanic membrane, ear canal and external ear normal.     Left Ear: Tympanic membrane, ear canal and external ear normal.     Nose: Nose normal.     Mouth/Throat:     Mouth: Mucous membranes are moist.     Pharynx: Oropharynx is clear.  Eyes:     Conjunctiva/sclera: Conjunctivae normal.  Cardiovascular:     Rate and Rhythm: Regular rhythm.     Heart sounds: Normal heart sounds.  Pulmonary:     Effort: Pulmonary effort is normal.     Breath sounds: Normal breath sounds.     Comments: Lungs clear to auscultation Musculoskeletal:     Cervical back: Neck supple.  Skin:    General: Skin is  warm.  Neurological:     Mental Status: He is alert and oriented to person, place, and time.  Psychiatric:        Mood and Affect: Mood normal.        Behavior: Behavior normal.        Thought Content: Thought content normal.        Judgment: Judgment normal.     Diagnostics: FVC 6.39 L (115%), FEV1 5.07 L (108%), FEV1/FVC 0.79.  Spirometry indicates normal spirometry.  Assessment and Plan: 1. Seasonal and perennial allergic rhinitis   2. Severe persistent asthma without complication   3. Gastroesophageal reflux disease, unspecified whether esophagitis present     Meds ordered this encounter  Medications   albuterol  (VENTOLIN  HFA) 108 (90 Base) MCG/ACT inhaler    Sig: Inhale 2 puffs every 4-6 hours as needed  for cough, wheeze, tightness in chest, or shortness of breath    Dispense:  18 g    Refill:  1   budesonide -glycopyrrolate -formoterol (BREZTRI  AEROSPHERE) 160-9-4.8 MCG/ACT AERO inhaler    Sig: Inhale 2 puffs into the lungs in the morning and at bedtime. with spacer and rinse mouth afterwards.    Dispense:  1 each    Refill:  5    Patient Instructions  Severe Persistent Asthma:well controlled Consider stepping down in therapy at next office if doing well - Maintenance inhaler:  Continue Breztri  160-9-4.16mcg 2 puffs twice daily with spacer. Rinse mouth out after Continue Fasenra  30mg  SQ every 8 weeks. Continue Singulair  10mg  daily.  - Rescue inhaler: Albuterol  2 puffs via spacer or 1 vial via nebulizer every 4-6 hours as needed for respiratory symptoms of cough, shortness of breath, or wheezing Asthma control goals:  Full participation in all desired activities (may need albuterol  before activity) Albuterol  use two times or less a week on average (not counting use with activity) Cough interfering with sleep two times or less a month Oral steroids no more than once a year No hospitalizations  Allergic Rhinitis: controlled - sIgE 08/2020 positive to dust mites, cat, dog, tree pollen, weed pollen. - Use Ryaltris  (olopatadine + mometasone nasal spray combination) 1-2 sprays per nostril twice daily as needed.  - Use over the counter antihistamines such as Zyrtec (cetirizine), Allegra (fexofenadine), or Xyzal (levocetirizine) daily as needed. May take twice a day during allergy  flares. May switch antihistamines every few months.  GERD:controlled - Continue Omeprazole  20mg  daily.  Can try weaning/stopping it since your symptoms are doing well.  But if reflux worsens, get back on it daily.  -Avoid lying down for at least two hours after a meal or after drinking acidic beverages, like soda, or other caffeinated beverages. This can help to prevent stomach contents from flowing back into the  esophagus. -Keep your head elevated while you sleep. Using an extra pillow or two can also help to prevent reflux. -Eat smaller and more frequent meals each day instead of a few large meals. This promotes digestion and can aid in preventing heartburn. -Wear loose-fitting clothes to ease pressure on the stomach, which can worsen heartburn and reflux. -Reduce excess weight around the midsection. This can ease pressure on the stomach. Such pressure can force some stomach contents back up the esophagus.   Follow up in 6 months or sooner if needed     Return in about 6 months (around 10/20/2024), or if symptoms worsen or fail to improve.    Thank you for the opportunity to care for this patient.  Please do  not hesitate to contact me with questions.  Wanda Craze, FNP [facility] 

## 2024-05-26 ENCOUNTER — Other Ambulatory Visit: Payer: Self-pay | Admitting: Internal Medicine

## 2024-07-03 ENCOUNTER — Other Ambulatory Visit: Payer: Self-pay | Admitting: Internal Medicine

## 2024-10-21 ENCOUNTER — Ambulatory Visit: Admitting: Family

## 2024-11-12 ENCOUNTER — Ambulatory Visit: Admitting: Family
